# Patient Record
Sex: Female | Born: 1970 | Race: White | Hispanic: No | Smoking: Never smoker
Health system: Southern US, Community
[De-identification: ages and names within clinical notes are randomized; demographics above are authoritative.]

## PROBLEM LIST (undated history)

## (undated) DIAGNOSIS — F419 Anxiety disorder, unspecified: Secondary | ICD-10-CM

## (undated) DIAGNOSIS — E079 Disorder of thyroid, unspecified: Secondary | ICD-10-CM

## (undated) DIAGNOSIS — I8390 Asymptomatic varicose veins of unspecified lower extremity: Secondary | ICD-10-CM

## (undated) DIAGNOSIS — R42 Dizziness and giddiness: Secondary | ICD-10-CM

## (undated) HISTORY — DX: Disorder of thyroid, unspecified: E07.9

## (undated) HISTORY — DX: Asymptomatic varicose veins of unspecified lower extremity: I83.90

## (undated) HISTORY — DX: Anxiety disorder, unspecified: F41.9

## (undated) HISTORY — PX: LEEP: SHX91

---

## 2004-01-21 ENCOUNTER — Other Ambulatory Visit: Admission: RE | Admit: 2004-01-21 | Discharge: 2004-01-21 | Payer: Self-pay | Admitting: Obstetrics and Gynecology

## 2004-05-25 ENCOUNTER — Ambulatory Visit: Payer: Self-pay | Admitting: Psychology

## 2004-05-29 ENCOUNTER — Ambulatory Visit: Payer: Self-pay | Admitting: Psychology

## 2004-06-01 ENCOUNTER — Ambulatory Visit: Payer: Self-pay | Admitting: Psychology

## 2004-06-08 ENCOUNTER — Ambulatory Visit: Payer: Self-pay | Admitting: Psychology

## 2004-06-14 ENCOUNTER — Ambulatory Visit: Payer: Self-pay | Admitting: Psychology

## 2004-06-21 ENCOUNTER — Ambulatory Visit: Payer: Self-pay | Admitting: Psychology

## 2004-06-28 ENCOUNTER — Ambulatory Visit: Payer: Self-pay | Admitting: Psychology

## 2004-07-06 ENCOUNTER — Ambulatory Visit: Payer: Self-pay | Admitting: Psychology

## 2004-07-12 ENCOUNTER — Ambulatory Visit: Payer: Self-pay | Admitting: Psychology

## 2004-08-07 ENCOUNTER — Ambulatory Visit: Payer: Self-pay | Admitting: Psychology

## 2004-08-15 ENCOUNTER — Ambulatory Visit: Payer: Self-pay | Admitting: Psychology

## 2004-08-21 ENCOUNTER — Ambulatory Visit: Payer: Self-pay | Admitting: Psychology

## 2004-09-04 ENCOUNTER — Ambulatory Visit: Payer: Self-pay | Admitting: Psychology

## 2004-09-12 ENCOUNTER — Ambulatory Visit: Payer: Self-pay | Admitting: Psychology

## 2004-09-20 ENCOUNTER — Ambulatory Visit: Payer: Self-pay | Admitting: Psychology

## 2004-09-28 ENCOUNTER — Ambulatory Visit: Payer: Self-pay | Admitting: Psychology

## 2004-10-04 ENCOUNTER — Ambulatory Visit: Payer: Self-pay | Admitting: Psychology

## 2004-10-09 ENCOUNTER — Ambulatory Visit: Payer: Self-pay | Admitting: Psychology

## 2004-10-18 ENCOUNTER — Ambulatory Visit: Payer: Self-pay | Admitting: Psychology

## 2004-10-23 ENCOUNTER — Ambulatory Visit: Payer: Self-pay | Admitting: Psychology

## 2004-10-25 ENCOUNTER — Ambulatory Visit: Payer: Self-pay | Admitting: Psychology

## 2005-02-14 ENCOUNTER — Other Ambulatory Visit: Admission: RE | Admit: 2005-02-14 | Discharge: 2005-02-14 | Payer: Self-pay | Admitting: Obstetrics and Gynecology

## 2005-03-13 ENCOUNTER — Ambulatory Visit: Payer: Self-pay | Admitting: Psychology

## 2005-10-25 ENCOUNTER — Ambulatory Visit: Payer: Self-pay | Admitting: Psychology

## 2006-08-28 ENCOUNTER — Ambulatory Visit: Payer: Self-pay | Admitting: Psychology

## 2006-12-25 ENCOUNTER — Ambulatory Visit: Payer: Self-pay | Admitting: Psychology

## 2007-06-05 ENCOUNTER — Ambulatory Visit: Payer: Self-pay | Admitting: Psychology

## 2007-12-04 ENCOUNTER — Inpatient Hospital Stay (HOSPITAL_COMMUNITY): Admission: AD | Admit: 2007-12-04 | Discharge: 2007-12-07 | Payer: Self-pay | Admitting: Obstetrics and Gynecology

## 2007-12-05 ENCOUNTER — Encounter (INDEPENDENT_AMBULATORY_CARE_PROVIDER_SITE_OTHER): Payer: Self-pay | Admitting: Obstetrics and Gynecology

## 2008-03-15 HISTORY — PX: OTHER SURGICAL HISTORY: SHX169

## 2008-03-24 ENCOUNTER — Encounter: Admission: RE | Admit: 2008-03-24 | Discharge: 2008-03-24 | Payer: Self-pay | Admitting: Obstetrics and Gynecology

## 2008-03-24 ENCOUNTER — Ambulatory Visit (HOSPITAL_COMMUNITY): Admission: RE | Admit: 2008-03-24 | Discharge: 2008-03-24 | Payer: Self-pay | Admitting: Obstetrics and Gynecology

## 2008-05-03 ENCOUNTER — Ambulatory Visit: Payer: Self-pay | Admitting: Psychology

## 2008-09-02 ENCOUNTER — Ambulatory Visit: Payer: Self-pay | Admitting: Psychology

## 2008-09-09 ENCOUNTER — Ambulatory Visit: Payer: Self-pay | Admitting: Psychology

## 2008-09-13 ENCOUNTER — Ambulatory Visit: Payer: Self-pay | Admitting: Psychology

## 2008-09-16 ENCOUNTER — Ambulatory Visit: Payer: Self-pay | Admitting: Sports Medicine

## 2008-09-16 ENCOUNTER — Encounter: Payer: Self-pay | Admitting: Family Medicine

## 2008-09-16 DIAGNOSIS — M2140 Flat foot [pes planus] (acquired), unspecified foot: Secondary | ICD-10-CM | POA: Insufficient documentation

## 2008-09-16 DIAGNOSIS — R269 Unspecified abnormalities of gait and mobility: Secondary | ICD-10-CM | POA: Insufficient documentation

## 2009-12-29 ENCOUNTER — Ambulatory Visit: Payer: Self-pay | Admitting: Psychology

## 2010-01-04 ENCOUNTER — Ambulatory Visit: Payer: Self-pay | Admitting: Psychology

## 2010-01-15 NOTE — L&D Delivery Note (Signed)
Delivery Note At 6:49 AM a viable female was delivered via Vaginal, Spontaneous Delivery (Presentation: ; Occiput Anterior).  APGAR: 9, 9; weight 9 lb 1.3 oz (4119 g).   Placenta status: Intact, Spontaneous.  Cord: 3 vessels with the following complications: None.  Cord pH: none  Anesthesia: Local  Episiotomy: None Lacerations: 2nd degree and right lateral tear with repair Suture Repair: chromic Est. Blood Loss (mL):500 cc   Mom to postpartum.  Baby to nursery-stable.  Keyasia Jolliff S 09/11/2010, 7:19 AM

## 2010-01-24 LAB — ABO/RH: RH Type: POSITIVE

## 2010-01-24 LAB — RPR: RPR: NONREACTIVE

## 2010-01-24 LAB — RUBELLA ANTIBODY, IGM: Rubella: IMMUNE

## 2010-01-24 LAB — ANTIBODY SCREEN: Antibody Screen: NEGATIVE

## 2010-01-24 LAB — HEPATITIS B SURFACE ANTIGEN: Hepatitis B Surface Ag: NEGATIVE

## 2010-01-24 LAB — HIV ANTIBODY (ROUTINE TESTING W REFLEX): HIV: NONREACTIVE

## 2010-02-14 NOTE — Assessment & Plan Note (Signed)
Summary: NP ORTHOTICS/JW   Vital Signs:  Patient profile:   40 year old female Height:      66 inches Weight:      160 pounds BMI:     25.92 BP sitting:   106 / 60  Vitals Entered By: Lillia Pauls CMA (September 16, 2008 2:12 PM)  History of Present Illness: Patient presents as a referral from Dr. Thurston Hole of orthopaedics for new orthotics. He had diagnosed her with pes planus and metatarsalgia. She originally started having right medial ankle pain after spraining her right ankle in June of 2009. She was pregnant at the time and took a long time to heal. However, she then developed pain across the dorsal midfoot of her bilateral feet shortly afterwards with the left foot hurting more than the right. This has been worked up by Dr. Thurston Hole who referred her for custom orthotics. The patient typically works out by going to the gym and going on the ellipitcal machine. She exercises most days of the week. She has a 6 month old daughter at home. She uses the barely there shoes. Pain is typically worse in the morning or after she gets up from a rest.   Physical Exam  General:  alert, well-developed, well-nourished, and well-hydrated.   Head:  normocephalic and atraumatic.   Msk:  On inspection her left shoulder is higher than her right shoulder. On gait she has pronation, pes planus and wobbling of her bilateral knees with a normal hip motion.  No scoliosis or leg length discrepency noted.  Ankle: Right No visible erythema or bony abnormalities. Slight prominence of navicular. Prominent sinus tarsi with edema on lateral right ankle. Range of motion is full in all directions with 50 degrees of inversion. Strength is 5/5 in all directions. Stable lateral and medial ligaments; squeeze test and kleiger test unremarkable; Talar dome nontender; No pain at base of 5th MT; No tenderness over cuboid; No tenderness over N spot or navicular prominence No tenderness on posterior aspects of lateral and  medial malleolus No sign of peroneal tendon subluxations; Negative tarsal tunnel tinel's Able to walk 4 steps. Splayed between 1st and 2nd toe. Toes 3-5 curling under and almost hammer toes. Hindfoot valgus.  Pes planus. Pronation with walking.   Ankle: Left No visible erythema or swelling. Range of motion is full in all directions. 30 degrees of inversion.  Strength is 5/5 in all directions. Stable lateral and medial ligaments; squeeze test and kleiger test unremarkable; Talar dome nontender; No pain at base of 5th MT; No tenderness over cuboid; No tenderness over N spot or navicular prominence No tenderness on posterior aspects of lateral and medial malleolus No sign of peroneal tendon subluxations; Negative tarsal tunnel tinel's Able to walk 4 steps. Splayed between 1st and 2nd toe. Toes 3-5 curling under and almost hammer toes. Hindfoot valgus. Pes planus. Pronation with walking.   After orthotics created she walked in a neutral position and did not have wobbling of her knees or as significant pronation.      Impression & Recommendations:  Problem # 1:  PES PLANUS (ICD-734) Assessment New  Patient has significant pes planus causing her bilateral foot pain. She had custom orthotics made for her today.  Patient was fitted for a : standard, cushioned, semi-rigid orthotic. The orthotic was heated and afterward the patient stood on the orthotic blank positioned on the orthotic stand. The patient was positioned in subtalar neutral position and 10 degrees of ankle dorsiflexion in a  weight bearing stance. After completion of molding, a stable base was applied to the orthotic blank. The blank was ground to a stable position for weight bearing. Size: 7 bilaterally Base: F3 black soft base bilaterally Posting: ray bilaterally Additional orthotic padding: None  Orders: [employer], each unit (Z6109)  Problem # 2:  GAIT DISTURBANCE (ICD-781.2) Assessment:  New  Improved gait after putting on new orthotics. They were comfortable and she is to return in 4 weeks for follow-up.   Orders: [employer], each unit [phone])

## 2010-02-14 NOTE — Letter (Signed)
Summary: Generic Letter  [facility]  [address]   [place], [place] [address]   Phone: [phone]  Fax: [phone]    09/16/2008  [facility] [address] [place], [place]  [address]  Dear Dr. Thurston Hole,    We had the pleasure of seeing Valecia Beske in our sports medicine clinic today for custom orthotics. She was noted to have pes planus bilaterally, pronation, hindfoot valgus and forefoot breakdown. She had custom orthotics made today in the usual fashion which greatly improved her gait so that she was not having significant pronation while using the orthotics. She also felt very comfortable in the orthotics. We will see her back in 4 weeks for follow-up. Thank you very much for this consulation.  Sincerely,   Jannifer Rodney MD

## 2010-02-23 ENCOUNTER — Ambulatory Visit (INDEPENDENT_AMBULATORY_CARE_PROVIDER_SITE_OTHER): Payer: BC Managed Care – PPO | Admitting: Psychology

## 2010-02-23 DIAGNOSIS — F4322 Adjustment disorder with anxiety: Secondary | ICD-10-CM

## 2010-03-14 ENCOUNTER — Ambulatory Visit: Payer: BC Managed Care – PPO | Admitting: Psychology

## 2010-04-18 ENCOUNTER — Encounter: Payer: BC Managed Care – PPO | Admitting: Sports Medicine

## 2010-04-27 LAB — CBC
HCT: 39.1 % (ref 36.0–46.0)
Hemoglobin: 13.1 g/dL (ref 12.0–15.0)
MCHC: 33.7 g/dL (ref 30.0–36.0)
MCV: 89.7 fL (ref 78.0–100.0)
Platelets: 297 10*3/uL (ref 150–400)
RBC: 4.36 MIL/uL (ref 3.87–5.11)
RDW: 13.3 % (ref 11.5–15.5)
WBC: 9.6 10*3/uL (ref 4.0–10.5)

## 2010-04-27 LAB — PREGNANCY, URINE: Preg Test, Ur: NEGATIVE

## 2010-05-17 DIAGNOSIS — O479 False labor, unspecified: Secondary | ICD-10-CM

## 2010-05-30 NOTE — Discharge Summary (Signed)
Sabrina Nichols, Sabrina Nichols                  ACCOUNT NO.:  [id number]   MEDICAL RECORD NO.:  [id number]          PATIENT TYPE:  INP   LOCATION:  9146                          FACILITY:  [facility]   PHYSICIAN:  Osborn Coho, M.D.   DATE OF BIRTH:  08/06/1970   DATE OF ADMISSION:  12/04/2007  DATE OF DISCHARGE:  12/07/2007                               DISCHARGE SUMMARY   ADMITTING DIAGNOSES:  1. Intrauterine pregnancy at 39-5/7 weeks.  2. Premature rupture of membranes at term.  3. Group beta strep negative.  4. Reactive fetal heart tracing.   DISCHARGE DIAGNOSES:  1. Intrauterine pregnancy at 39-5/7 weeks.  2. Premature rupture of membranes at term.  3. Group beta strep negative.  4. Reactive fetal heart tracing.  5. Stable postop a primary low transverse cesarean section for non-      reassuring fetal heart tracing with delivery December 05, 2007 at      1730 hours.  Findings, a viable female infant by the name of Sophie      weighing 7 pounds even (3195 grams) and 20 inches in length.  6. Lactating.   HOSPITAL PROCEDURES:  1. Epidural anesthesia.  2. Primary low transverse cesarean section.   HOSPITAL COURSE:  Sabrina Nichols is a 40 year old gravida 1, para 0 who  presented on the day of admission December 04, 2007 at 39-5/7 weeks with  complaint of questionable SROM around 1:30 a.m. on the 19th.  She  reported clear fluid with only occasional uterine contractions.  Reported good fetal movement.  No bleeding.  Her cervix had been  fingertip a few days prior to that in the office.  Her pregnancy had  been remarkable for:  1. Group beta strep negative.  2. History of LEEP  3. Advanced maternal age with a negative and normal genetic screening.  4. Allergy to PERCOCET.   On admission, spontaneous rupture was confirmed with positive pooling,  positive fern, and positive Nitrazine and with clear fluid.  Cervix was  fingertip, 50% vertex presentation and minus 2 station.  The patient was  admitted to birthing suites.  Was offered augmentation or induction of  labor but at that time desired to continue observation for signs or  symptoms of labor.  She was afebrile and her vital signs were stable at  time of admission.  Throughout that day temperature was taken every 2  hours to observe for any signs or symptoms of infection.  Fetal heart  tracing was reactive with some irregular uterine contractions.  The  patient and husband desired to continue observation without any  interventions.  She continued leaking clear fluid throughout the day and  did complain of some just mild cramping.  Around 7 p.m. on the 19th  after consultation with Dr. Estanislado Pandy, an ACOG Technical Bulletin in August  2009 was discussed with the patient, which did give option for cervical  ripening with SROM.  The patient was recommended to begin Cervidil for  ripening and the patient was agreeable.  She had Cervidil placed x1 on  the night of the 19th around  8:15 p.m.  At that time her cervix was 1  cm, 50% and minus 2.  She remained afebrile.  Her vital signs were  stable and fetal heart tracing remained reactive and reassuring without  decelerations.  The patient was able to rest overnight and temperature  was continued every 2 hours to be monitored as well as for any other  signs or symptoms of infection.  She continued leaking clear fluid  overnight.  The morning of the 20th, she woke up around 4 a.m. with some  increasing contraction strength as well as pattern.  She did have some  pinkish discharge.  Cervidil was removed that a.m. and Pitocin was  begun.  At 10:45 a.m. on the 20th she was comfortable and employing  Hypnobirth measures with good results.  She remained afebrile.  Fetal  heart tracing remained reactive, but she did have occasional brief  variables ,less than 20 seconds in duration, and contractions were every  2-1/2 to 5 minutes and cervical exam was continued to be deferred to  decrease  risk of infection.  Around 1 p.m. the patient was requesting  epidural.  She was on 12 mU of Pitocin.  She was contracting every 2 to  3 minutes and they were strong.  The patient did have some decelerations  in supine position with nadir in 60 to 70s some times lasting for 2  minutes.  She did have a forebag that was ruptured around that time, and  she was around 4 cm.  At 1600 hours on the 20th she was complaining of  some rectal pressure but was otherwise comfortable status post epidural  placement as well as Foley placement.  Pitocin had been off secondary to  some inconsistent variables.  Her vaginal exam was 9 cm and +1 station  with rim on the right side.  The patient's variables were relieved off  and on with position changes and oxygen as well as IV bolus.  Around  1600 hours, the patient did have a deep deceleration with contractions  and Dr. Su Hilt was called to evaluate.  Pitocin remained off and her  option remained on the mask.  Around 4:30 p.m. Dr. Su Hilt was on unit  to evaluate fetal heart tracing.  The decelerations did resolve with the  patient in house high Fowler's position.  Fetal heart rate was in the  140s with moderate variability.  She was having some shallow variables  down to 120s of nadir, inconsistently not with every contraction.  She  was contracting every 2 to 4 minutes.  Pitocin remained off and plan was  made for expectant management.  Around 5:15 p.m. she also had a sudden  deep deceleration right after top of the hour.  Position was changed,  cervix was 10 cm plus 1 station and Janna Melsness, the C.N.M. did call  for terbutaline.  IV bolus was started.  Nadir was below 60 beats per  minute on fetal heart tracing, recovered to 150s after approximately 3  minutes, and then with the next contraction deceleration again returned  to approximately 70 beats per minute with some recovery after  stimulation to the 160s.  Following these decelerations,  C-section was  recommended to the patient.  Risks, benefits, alternatives were reviewed  and the patient was agreeable to proceed with C-section.  Some pushing  attempts were attempted prior to going to the OR.  The patient was taken  to OR where a primary low transverse cesarean section was  performed by  Dr. Osborn Coho attending and [facility], C.N[facility]M. assisting and  was under epidural anesthesia.  Findings were a viable female infant by  the name of Sophie delivered at 1730 hours on November 20, weighing 7  pounds even (3195 grams), and 20 inches in length.  The patient  tolerated well and went to recovery in good condition and newborn to  full-term nursery.  By postoperative day #1 the patient was up at  bedside.  She was ambulating without dizziness or syncope.  She was  tolerating a regular diet.  At that time she had a Foley catheter  recently discontinued.  She had not had a spontaneous void yet.  She was  breast-feeding with assistance, had light vaginal bleeding and pain was  well managed with Motrin and p.r.n. Darvocet.  Her vital signs were  afebrile and she was stable.  Intake showed 2153 mL and output was 850  mL, which is a positive net balance but her physical exam was within  normal limits.  Abdomen was soft, nontender.  Fundus was firm, U -1.  Her lungs were clear bilaterally.  Incision dressing was clean, dry and  intact.  She had mild 1+ edema, negative Homan's.  The patient did voice  desire for possible early discharge on postoperative day #2 and routine  postpartum care continued.  By postoperative day #2, on the 22nd, she  was doing well.  She did continue to voice desire for early discharge  home and felt like she had benefit from her stay.  Pain was well managed  with p.r.n. Darvocet and Motrin.  She was up ad lib tolerating a regular  diet.  Some increase in edema in all 4 extremities.  She had light  vaginal bleeding.  She is undecided on birth control  method.  She is  voiding without difficulty, positive flatulence.  She has continued  having some questions regarding labor and delivery and all were  answered.  The patient's physical exam remained within normal limits.  Her lungs were clear bilaterally.  Abdomen was soft and nontender and  bowel sounds present x4.  Fundus was firm, U -2 and her incision was  open to air, no drainage or redness and Dermabond was intact.  Her  extremities had 1 to 2+ pitting edema bilaterally lower extremities.  She had some generalized edema in her upper extremities but negative  Homan's.  The patient was deemed to have received full benefit of her  hospital stay and was discharged home in stable condition.   DISCHARGE MEDICATIONS:  1. Motrin 600 mg p.o. q.6 hours p.r.n. pain.  2. Darvocet-N 100 1 tablet p.o. q.4 hours p.r.n. moderate to severe      pain.  3. Prenatal vitamin 1 tablet p.o. daily.  4. Colace 100 mg 1 tablet p.o. b.i.d. p.r.n. constipation and while on      Darvocet.  5. She can also obtained vitamin B6 1 tablet p.o. b.i.d. p.r.n. edema.   DISCHARGE INSTRUCTIONS:  [facility] pamphlet.  Warning signs and symptoms to  report were reviewed with the patient.  Followup is to occur and 6 weeks  at [facility] or as needed.      Candice Denny Levy, CNM      ______________________________  Osborn Coho, M.D.    CHS/MEDQ  D:  12/07/2007  T:  12/07/2007  Job:  [id number]

## 2010-05-30 NOTE — Op Note (Signed)
NAMEARSEMA, TUSING                  ACCOUNT NO.:  [id number]   MEDICAL RECORD NO.:  [id number]          PATIENT TYPE:  AMB   LOCATION:  SDC                           FACILITY:  [facility]   PHYSICIAN:  Crist Fat. Rivard, M.D. DATE OF BIRTH:  03/25/1970   DATE OF PROCEDURE:  DATE OF DISCHARGE:                               OPERATIVE REPORT   PREOPERATIVE DIAGNOSIS:  Uterine perforation with abdominal intra-  abdominal intrauterine device.   POSTOPERATIVE DIAGNOSIS:  Uterine perforation with abdominal intra-  abdominal intrauterine device.   ANESTHESIA:  General.   PROCEDURE:  Laparoscopy with removal of intrauterine device from the  abdominal cavity.   SURGEON:  Crist Fat. Rivard, MD   No assistant.   ESTIMATED BLOOD LOSS:  Minimal.   PROCEDURE:  After being informed of the planned procedure with possible  complications including bleeding, infection, injury to other organs, and  the need for laparotomy, informed consent was obtained.  The patient was  taken to OR #3, given general anesthesia with endotracheal intubation  without any complication.  She was placed in lithotomy position, prepped  and draped in a sterile fashion, and a Foley catheter was inserted in  her bladder.  Pelvic exam reveals an anteverted uterus normal in size  and shape, 2 normal adnexa.  A speculum was inserted.  Anterior lip of  the cervix was grasped with a tenaculum forceps, and an acorn  manipulator was placed without difficulty.  The speculum was removed.   The umbilical area was infiltrated with 5 mL of Marcaine 0.25, and we  performed a semi-elliptical incision which was brought down bluntly to  the fascia.  The fascia was identified, grasped with Kocher forceps, and  incised with Mayo scissors and peritoneum was entered bluntly.  A  pursestring suture of 0 Vicryl was placed on the fascia holding in place  a 10-mm Hasson trocar which was easily inserted in the abdominal cavity.  This allowed for easy  insufflation of the pneumoperitoneum using CO2 at  a maximum pressure of 15 mmHg.   An operative laparoscope was inserted in the abdominal cavity while the  patient was still in lithotomy position with no Trendelenburg.  At this  time, we were able to visualize the liver and the gallbladder which are  normal and the appendix which was normal, but the IUD was not visible.  We proceeded with slow Trendelenburg positioning of the patient while  observing the bowel loops moving upward in the abdominal cavity hoping  to catch a glimpse of the IUD while this was happening.  Reviewing the  flat plate performed at [facility] earlier today, we see that  while lying down the IUD appeared to be in the right upper quadrant and  when standing up it moved freely to the lower pelvis.  After all the  small bowel loops have moved forward, we still have not visualized the  intrauterine device, and the patient was now placed in a steep  Trendelenburg position and the uterus was elevated.  The uterus shows a  previous site of perforation at  the right cornua which was partly  healed.  No bleeding is seen.  Both tubes and both ovaries were normal.  Posterior cul-de-sac was normal.  We did see little bit of adhesions  between the bladder flap and the lower uterine segment from the previous  C-section 4 months ago.  The large bowel was mobilized from one side to  the other, and we still can not visualize the IUD and so decision was  made to place a second trocar suprapubically 5 mm under direct  visualization after infiltrating with Marcaine 0.25 which allowed [place] to  use an atraumatic grasper and to systematically unroll all bowel loops.  We were lucky and we got a glimpse of the IUD and the omentum in the  right upper quadrant below the liver edge.  This was grasped and moved  downward towards the pelvis.  Some traction was required since the  omentum had kind of encapsulated both ears of the IUD, but  with gentle  traction and countertraction we were able to free the IUD and remove it  from the pelvic cavity through the 5-mm suprapubic trocar.  It was  complete.   We did require a little bit of cauterization on the omental edge after  removing the IUD, but this was performed easily with a Kleppinger.   We then proceeded with profuse irrigation of the abdominopelvic cavity  using 3 L of warm saline.   Trocars were then removed after evacuating the pneumoperitoneum.  The  fascia of the umbilical incision was closed with the previously placed  pursestring suture of 0 Vicryl.  Skin was closed with subcuticular  suture of 3-0 Monocryl and Dermabond.   Instruments and sponge count was complete x2.  Estimated blood loss was  minimal.  The procedure was well tolerated by the patient who was taken  to recovery room and discharge home in a well and stable condition.   SPECIMEN:  Mirena IUD was discarded.      Crist Fat Rivard, M.D.  Electronically Signed     SAR/MEDQ  D:  03/24/2008  T:  03/25/2008  Job:  [id number]

## 2010-05-30 NOTE — H&P (Signed)
NAMEFENDI, MEINHARDT                  ACCOUNT NO.:  [id number]   MEDICAL RECORD NO.:  [id number]          PATIENT TYPE:  INP   LOCATION:  9170                          FACILITY:  [facility]   PHYSICIAN:  Crist Fat. Rivard, M.D. DATE OF BIRTH:  12-30-1970   DATE OF ADMISSION:  12/04/2007  DATE OF DISCHARGE:                              HISTORY & PHYSICAL   Sabrina Nichols is a 40 year old gravida 1, para 0 at 39-6/7 weeks who  presents complaining of questionable spontaneous rupture of membranes at  approximately 1:30 a.m. with clear fluid noted and only occasional  uterine contractions since.  She reports positive fetal movement.  Cervix was closed to a fingertip last week. Pregnancy has been  remarkable for:  1. Group B strep negative.  2. History of LEEP procedure.  3. Advanced maternal age with negative genetic screening.  4. Allergy to Percocet.   PRENATAL LABS:  Blood type is AB positive, Rh antibody negative, VDRL  nonreactive, rubella titer positive, hepatitis B surface antigen  negative, HIV is nonreactive.  Toxo screens were negative.  First  trimester screen was negative.  AFP was normal.  GC and Chlamydia  cultures were negative in March.  Pap was normal in March.  Hemoglobin  upon entering the practice was 12.9.  It was within normal limits at 28  weeks.  This patient had a normal Glucola.  Group B strep culture was  negative at 36 weeks.   HISTORY OF PRESENT PREGNANCY:  The patient entered care at approximately  23 weeks as a transfer from [facility] requesting certified nurse  midwife care.  She had an ultrasound in the first trimester with an EDC  established of December 09, 2007 which was consistent with [facility] by LMP of  December 06, 2007. She had a small subcarinal hemorrhage noted on one of  her early ultrasounds. There was also an 18-week ultrasound showing a  marginal previa. At 23 weeks at [facility] she still had a  marginal anterior previa noted.  There were some  other limitations of  anatomy. This was reevaluated at 27 weeks with all anatomy structure  seen and ultrasound showed there was no longer a previa. Cervix had good  cervical length.  She did have hemorrhoids through her pregnancy that  were treated with topical agents. She had a normal Glucola.  She had  negative group B strep.  The rest of her pregnancy was essentially  uncomplicated.  She was seen by Dr. Estanislado Pandy this past week for the nurse  midwife service and she was closed to fingertip dilated.   OBSTETRICAL HISTORY:  The patient is a primigravida.   MEDICAL HISTORY:  The patient was a previous oral contraceptive user of  Yaz and Yasmin. She also had Seasonique and Seasonale.  She discontinued  all oral contraceptives in October 2008. At age 59 she had a LEEP  procedure for an abnormal Pap and has had normal Pap since.  Patient has  also had one UTI in the past.   PAST SURGICAL HISTORY:  Surgical history includes the previously noted  LEEP procedure at age 65 and wisdom teeth removed as a teenager.   She is sensitive to Percocet which causes vomiting.   FAMILY HISTORY:  Her maternal uncle has chronic hypertension.  Her  mother has varicosities.  Her brother has asthma.  Paternal grandfather  has diabetes.  Her mother is hypothyroid.    Genetic history is remarkable for the patient being 40 years old. The  patient's half-sisters child has Higashi syndrome   SOCIAL HISTORY:  The patient is married to the father of baby.  He is  involved and supportive. His name is Durwin Nora.  The patient is  [place] in ethnicity. She denies any religious affiliation.  She is  college educated.  She is employed in [employer]. Her husband has two  years of college.  He is a Emergency planning/management officer.  She has been followed by the  certified nurse midwife service at [facility].  She denies any  alcohol, drug or tobacco use during this pregnancy.   PHYSICAL EXAM:  VITAL SIGNS: Stable.  The patient  is afebrile.  HEENT: Within normal limits.  LUNGS:  Breath sounds are clear.  HEART:  Regular rate and rhythm without murmur.   BREASTS:  Soft and nontender.  ABDOMEN:  Fundal height is approximately 39 cm.  GENITOURINARY:  Estimated fetal weight 7-8 pounds.  Uterine contractions  are very occasional and mild. Serial speculum reveals positive pooling,  positive fern, positive Nitrazine. Fetal heart rate is reactive with no  decelerations.  There is clear fluid noted.  Cervix is a fingertip, 50%  vertex at minus two station.  EXTREMITIES:  Deep to reflexes are 2+ without clonus.  There is trace  edema noted.   IMPRESSION:  1. Intrauterine pregnancy at 39-6/7 weeks.  2. Premature rupture of membranes at term prior to the onset of labor.  3. Negative group B strep.  4. Reactive NFT.   PLAN:  1. After discussion with the patient regarding her options of      discharge home to await increased labor or admission for further      observation and await the onset of labor for the initiation of      Pitocin, the patient wishes to be admitted for observation. This      was done under the service of Dr. Silverio Lay, who is attending      physician.  2. Routine certified nurse midwife orders.  3. At present given the patient's desire to observe for the onset of      spontaneous labor, we will allow the patient to eat and allow her      to ambulate.  We will intermittently monitor the fetus. We will      monitor maternal vital signs and observe for any signs of infection      that may ensue. We will reevaluate her labor status in the evening      of November 19, and if  no labor has ensued by the morning of      November 20, then we will recommend starting      Pitocin.  4. Risks and benefits of prolonged rupture of membranes reviewed with      the patient, including infection, prolonged labor and other risk      factors. The patient still wishes to proceed with observational       plan.      Renaldo Reel Emilee Hero, C.N.M.      Crist Fat Rivard, M.D.  Electronically Signed    VLL/MEDQ  D:  12/04/2007  T:  12/04/2007  Job:  [id number]

## 2010-05-30 NOTE — H&P (Signed)
[name], [name]                  ACCOUNT NO.:  [id number]   MEDICAL RECORD NO.:  [id number]          PATIENT TYPE:  AMB   LOCATION:  SDC                           [facility]ILITY:  [facility]   PHYSICIAN:  Crist Fat. Rivard, M.D. DATE OF BIRTH:  [birth year]   DATE OF ADMISSION:  03/24/2008  DATE OF DISCHARGE:                              HISTORY & PHYSICAL   HISTORY OF PRESENT ILLNESS:  Sabrina Nichols is a 40 year old married breast-  feeding female para 1-0-0-1 who presented for an intrauterine device  insertion (Mirena) February 26, 2008, with complaints of pelvic pain  since that time.  The patient described her pain is intermittent, but  intense cramping primarily in her right pelvic region.  The patient did  find relief with ibuprofen and eventually over time, the patient's pain  decreased.  In spite of it decreasing however, the patient did have  persistent discomfort in her right lower quadrant therefore, she  returned for follow-up visit.  Her exam on March 16, 2008. did not  demonstrate any pelvic pain or tenderness however, it was also noted  that the patient's IUD string was no longer visible.  A pelvic  ultrasound on March 16, 2008, showed the intrauterine device within the  endometrial cavity with normal uterus and ovaries.  One week later due  to persistent though diminished pelvic discomfort the patient decided to  have the IUD removed under 3-D ultrasound guidance.  During the process  of exploring the endometrium for IUD removal, no IUD was seen.  A follow-  up plain abdominal film showed in the supine position that the IUD was  noted in the right midabdomen at the L5 level.  An additional review in  the erect position, the IUD was noted in the true pelvis.  Bowel gas  pattern was unremarkable and there was no free intraperitoneal gas.  Given these findings the patient has consented to proceed with  laparoscopic removal of her abdominal IUD.   PAST MEDICAL HISTORY:  1. OB history:   Gravida 1, para 1-0-0-1.  The patient underwent      cesarean section in December 05, 2007.  2. GYN history:  Menarche at 40 years old.  The patient is using      abstinence as a method of contraception.  Last menstrual period      February 29, 2008.  She has a history of human papillomavirus.  She      underwent a LEEP procedure at age 81.  She has had normal Pap      smears since then with the most recent being March, 2009.  3. Medical history is negative.  4. Surgical history is negative.   FAMILY HISTORY:  Positive for asthma and diabetes mellitus.   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  Habits:  She does not use  tobacco, alcohol or illicit drugs.   CURRENT MEDICATIONS:  1. Ibuprofen as needed.  2. Prenatal vitamins 1 tablet daily.   SHE IS ALLERGIC TO PERCOCET.  REVIEW OF SYSTEMS:  The patient denies any chest pain, shortness of  breath, headache, vision changes, nausea, vomiting, diarrhea, fever,  flank pain, urinary tract symptoms, vaginitis symptoms and except as is  mentioned in history of present illness the patient's review of systems  is otherwise negative.   PHYSICAL EXAMINATION:  Blood pressure 120/76, weight is 179, height is 5  feet, 6 inches tall.  BACK EXAM:  No CVA tenderness.  ABDOMINAL EXAM:  No tenderness, masses, or organomegaly.  PELVIC EXAM:  EG, BUS was normal.  Vagina was normal.  Cervix was  nontender without lesions.  There was no visible IUD string.  The uterus  normal size, shape and consistency without tenderness.  It is  anteverted.  Adnexa without tenderness or masses.  The patient's urine  pregnancy test is negative.  The patient's temperature was 97.8 degrees Fahrenheit orally.   Urinalysis was also negative.   IMPRESSION:  1. Pelvic pain.  2. Abdominal intrauterine (contraceptive) device.   DISPOSITION:  A discussion was held with the patient regarding the   indications for procedure, along with its risks which include, but are  not limited to reaction to anesthesia, damage to adjacent organs,  infection and excessive bleeding.  The patient verbalized understanding  of these risks and has consented to proceed with laparoscopic removal of  the Mirena intrauterine device at [facility] on March 24, 2008.      [name].      Crist Fat Rivard, M.D.  Electronically Signed    EJP/MEDQ  D:  03/24/2008  T:  03/24/2008  Job:  [id number]

## 2010-05-30 NOTE — Op Note (Signed)
Sabrina Nichols, Sabrina Nichols                  ACCOUNT NO.:  [id number]   MEDICAL RECORD NO.:  [id number]          PATIENT TYPE:  INP   LOCATION:  9146                          FACILITY:  [facility]   PHYSICIAN:  Osborn Coho, M.D.   DATE OF BIRTH:  04-26-1970   DATE OF PROCEDURE:  12/05/2007  DATE OF DISCHARGE:                               OPERATIVE REPORT   PREOPERATIVE DIAGNOSES:  1. Term intrauterine pregnancy.  2. Spontaneous rupture of membranes with Pitocin augmentation.  3. Nonreassuring fetal heart tracing.   POSTOPERATIVE DIAGNOSIS:  1. Term intrauterine pregnancy.  2. Spontaneous rupture of membranes with Pitocin augmentation.  3. Nonreassuring fetal heart tracing.  4. Occult cord.   PROCEDURE:  Primary low-transverse C-section.   ANESTHESIA:  Epidural.   ATTENDING:  Osborn Coho, MD   ASSISTANT:  Eulogio Bear, CNM.   FLUIDS:  1800 mL.   URINE OUTPUT:  90 mL.   ESTIMATED BLOOD LOSS:  700 mL.   COMPLICATIONS:  None.   FINDINGS:  Live female infant with Apgars of 9 at 1 minute and 9 at 5  minutes, weighing 7 pounds 0 ounces in Sophie normal-appearing  bilateral ovaries and fallopian tubes.   SPECIMEN:  Placenta to pathology.   COMPLICATIONS:  None.   PROCEDURE:  The patient was taken to the operating room after risks,  benefits, and alternatives discussed with the patient.  The patient  verbalized understanding and consent signed and witnessed.  The patient  was given a surgical level via the epidural, and prepped and draped in  normal sterile fashion in a supine position. A Pfannenstiel skin  incision was made and carried down to the underlying layer of fascia  with a scalpel and a Bovie.  The fascia was excised bilaterally in the  midline and extended bilaterally with the Mayo scissors.  Kocher clamps  were placed on the inferior aspect of the fascial incision and the  rectus muscle excised from the fascia.  The same was done in the  superior aspect of the  fascial incision.  The rectus muscle was  separated in the midline and the peritoneum entered bluntly and extended  manually.  The bladder blade was placed and bladder flap created with a  Metzenbaum scissors.  The uterine incision was made with a scalpel,  extended bilaterally with a bandage scissors.  Occult cord was noted and  infant was delivered in the vertex presentation.  The cord was clamped  and cut and the infant handed to the waiting pediatricians.  The  placenta was removed via fundal massage and the uterus cleared of all  clots and debris.  The uterine incision was repaired with 0 Vicryl in a  running locked fashion and a second imbricating layer was performed.  In  addition to an occult cord, there was a nuchal cord and a body cord.  After repairing the uterine incision in 2 layers with the second being  an imbricating layer the intra-abdominal cavity was copiously irrigated.  Bilateral adnexa appeared to be within normal limits.  The peritoneum  was repaired with 2-0 chromic  in a running fashion.  The fascia was  repaired with 0 Vicryl in a running fashion.  The subcutaneous tissue  was irrigated and made hemostatic with the Bovie.  The subcutaneous  tissue was reapproximated using 3 interrupted stitches of 2-0 plain.  The skin was reapproximated using 3-0 Monocryl via a subcuticular  stitch.  The end of the incisions for approximately 2 cm were prepped  with Dermabond.  The incision was dressed with a dressing.  Sponge, lap,  and needle count was correct.  The patient tolerated the procedure well  and was returned to the recovery room in good condition.      Osborn Coho, M.D.  Electronically Signed     AR/MEDQ  D:  12/05/2007  T:  12/06/2007  Job:  [id number]

## 2010-08-10 LAB — STREP B DNA PROBE: GBS: NEGATIVE

## 2010-09-10 ENCOUNTER — Inpatient Hospital Stay (HOSPITAL_COMMUNITY)
Admission: AD | Admit: 2010-09-10 | Discharge: 2010-09-12 | DRG: 373 | Disposition: A | Discharge status: Home / Self Care | Payer: BC Managed Care – PPO | Source: Ambulatory Visit | Attending: Obstetrics and Gynecology | Admitting: Obstetrics and Gynecology

## 2010-09-10 ENCOUNTER — Encounter (HOSPITAL_COMMUNITY): Payer: Self-pay

## 2010-09-10 DIAGNOSIS — O09529 Supervision of elderly multigravida, unspecified trimester: Secondary | ICD-10-CM | POA: Diagnosis present

## 2010-09-10 DIAGNOSIS — O34219 Maternal care for unspecified type scar from previous cesarean delivery: Secondary | ICD-10-CM | POA: Diagnosis present

## 2010-09-10 DIAGNOSIS — M2140 Flat foot [pes planus] (acquired), unspecified foot: Secondary | ICD-10-CM

## 2010-09-10 DIAGNOSIS — R269 Unspecified abnormalities of gait and mobility: Secondary | ICD-10-CM

## 2010-09-10 LAB — CBC
HCT: 36.6 % (ref 36.0–46.0)
Hemoglobin: 12.3 g/dL (ref 12.0–15.0)
MCH: 30.4 pg (ref 26.0–34.0)
MCHC: 33.6 g/dL (ref 30.0–36.0)
MCV: 90.4 fL (ref 78.0–100.0)
Platelets: 260 10*3/uL (ref 150–400)
RBC: 4.05 MIL/uL (ref 3.87–5.11)
RDW: 13.4 % (ref 11.5–15.5)
WBC: 12.2 10*3/uL — ABNORMAL HIGH (ref 4.0–10.5)

## 2010-09-10 LAB — POCT FERN TEST: Fern Test: POSITIVE

## 2010-09-10 MED ORDER — SODIUM CHLORIDE 0.9 % IJ SOLN: 3.0000 mL | Freq: Two times a day (BID) | INTRAMUSCULAR | Status: DC

## 2010-09-10 MED ORDER — LIDOCAINE HCL (PF) 1 % IJ SOLN
30.0000 mL | INTRAMUSCULAR | Status: DC | PRN
  Administered 2010-09-11: 30 mL via SUBCUTANEOUS
  Filled 2010-09-10: qty 30

## 2010-09-10 MED ORDER — SODIUM CHLORIDE 0.9 % IV SOLN: 250.0000 mL | INTRAVENOUS | Status: DC

## 2010-09-10 MED ORDER — SODIUM CHLORIDE 0.9 % IJ SOLN: 3.0000 mL | INTRAMUSCULAR | Status: DC | PRN

## 2010-09-10 MED ORDER — CITRIC ACID-SODIUM CITRATE 334-500 MG/5ML PO SOLN: 30.0000 mL | ORAL | Status: DC | PRN

## 2010-09-10 NOTE — Progress Notes (Signed)
Pt ambulating in hallway. Adjusting FHR cardio monitor. Pt states she feels fetal movement.

## 2010-09-10 NOTE — Progress Notes (Signed)
Pt ambulating, coming back to room. Pt feels fetal movement. Adjusting FHR cardio, on telemetry.

## 2010-09-10 NOTE — Progress Notes (Signed)
Pt demanding to speak to an MD, if Dr Arelia Sneddon will not talk with her, she would like to speak to DR Vincente Poli. Dr Arelia Sneddon agreed to speak with pt over phone.

## 2010-09-10 NOTE — Progress Notes (Signed)
Pt reports LOF @ 0900.

## 2010-09-10 NOTE — Progress Notes (Signed)
Patient wearing a pad wet with clear fluid, contractions 5 to 6 minutes, 3 cm last Wednesday.

## 2010-09-10 NOTE — Progress Notes (Signed)
Pt ambulating in hallway. Pt states she feels fetal movement. Adjusting FHR cardio monitor, on telemetry difficulty keeping monitors in place while walking. Dr Arelia Sneddon notified of this issue. Wants pt to remain on continuous monitoring.

## 2010-09-10 NOTE — Plan of Care (Signed)
Problem: Consults Goal: Birthing Suites Patient Information Press F2 to bring up selections list  Outcome: Completed/Met Date Met:  09/10/10  Pt > [redacted] weeks EGA and Other (specify with a note) VBAC

## 2010-09-10 NOTE — Progress Notes (Signed)
Pt ambulating in hallway. Pt states she continues to feel fetal movement. Adjusting FHR cardio. On telemetry, difficulty keeping monitors in place.

## 2010-09-10 NOTE — H&P (Signed)
Sabrina Nichols is a 40 y.o. female at 54 weeks presents with srom.  proir low transverse cesarean section.  Desire trial of labor .  GBS negative. Maternal Medical History:  Reason for admission: Reason for admission: rupture of membranes.  Contractions: Onset was 1-2 hours ago.   Frequency: regular.   Perceived severity is mild.    Fetal activity: Perceived fetal activity is normal.    Prenatal Complications - Diabetes: none.    OB History    Grav Para Term Preterm Abortions TAB SAB Ect Mult Living   2 1 1       1      No past medical history on file. No past surgical history on file. Family History: family history is not on file. Social History:  does not have a smoking history on file. She does not have any smokeless tobacco history on file. Her alcohol and drug histories not on file.  Review of Systems  All other systems reviewed and are negative.      Blood pressure 126/76, pulse 77, temperature 97.7 F (36.5 C), temperature source Oral, resp. rate 20, height 5\' 6"  (1.676 m), weight 205 lb (92.987 kg). Maternal Exam:  Uterine Assessment: Contraction strength is mild.  Contraction frequency is irregular.   Abdomen: Patient reports no abdominal tenderness. Surgical scars: low transverse.   Fundal height is c/w dates.   Estimated fetal weight is 8 lbs.   Fetal presentation: vertex  Introitus: Amniotic fluid character: clear.  Cervix: Cervix evaluated by sterile speculum exam.     Physical Exam  Prenatal labs: ABO, Rh:   Antibody:   Rubella:   RPR:    HBsAg:    HIV:    GBS:     Assessment/Plan: IUP at 41 weeks with SROM.  Pior LTCS for TOL.  Risk discussed including 1-2% risk of uterine scar rupture.  This could result in need for transfusion with risk of AIDS or Hepatitis.  Excessive bleeding could lead to need for Hysterectomy.  Potential fetal damage of death with scar rupture occurs 3% or time with rupture.  Patient express a understanding of potential risk and  conplications.     Sabrina Nichols S 09/10/2010, 7:35 PM

## 2010-09-10 NOTE — ED Provider Notes (Signed)
History     Chief Complaint  Patient presents with  . Rupture of Membranes  . Contractions   HPI  Asked to do speculum exam to rule out rupture of membranes.  No past medical history on file.  No past surgical history on file.  No family history on file.  History  Substance Use Topics  . Smoking status: Not on file  . Smokeless tobacco: Not on file  . Alcohol Use: Not on file    Allergies:  Allergies  Allergen Reactions  . Percocet (Oxycodone-Acetaminophen) Nausea And Vomiting    Prescriptions prior to admission  Medication Sig Dispense Refill  . prenatal vitamin w/FE, FA (PRENATAL 1 + 1) 27-1 MG TABS Take 1 tablet by mouth daily.          ROS Physical Exam   Blood pressure 125/84, pulse 81, temperature 99.1 F (37.3 C), resp. rate 18, height 5\' 6"  (1.676 m), weight 208 lb 12.8 oz (94.711 kg).  Physical Exam Speculum:  Small amount pooling of clear fluid.  + Fern Cervix 3cm/80/-2/vtx  MAU Course  Procedures  MDM   Assessment and Plan  Will have RN notify MD for admission.  Wynelle Bourgeois 09/10/2010, 6:16 PM

## 2010-09-11 ENCOUNTER — Encounter (HOSPITAL_COMMUNITY): Payer: Self-pay | Admitting: *Deleted

## 2010-09-11 LAB — RPR: RPR Ser Ql: NONREACTIVE

## 2010-09-11 LAB — CBC
HCT: 29.1 % — ABNORMAL LOW (ref 36.0–46.0)
Hemoglobin: 9.7 g/dL — ABNORMAL LOW (ref 12.0–15.0)
MCH: 30.2 pg (ref 26.0–34.0)
MCHC: 33.3 g/dL (ref 30.0–36.0)
MCV: 90.7 fL (ref 78.0–100.0)
Platelets: 219 10*3/uL (ref 150–400)
RBC: 3.21 MIL/uL — ABNORMAL LOW (ref 3.87–5.11)
RDW: 13.2 % (ref 11.5–15.5)
WBC: 18.1 10*3/uL — ABNORMAL HIGH (ref 4.0–10.5)

## 2010-09-11 MED ORDER — BISACODYL 10 MG RE SUPP: 10.0000 mg | Freq: Every day | RECTAL | Status: DC | PRN

## 2010-09-11 MED ORDER — IBUPROFEN 600 MG PO TABS
600.0000 mg | ORAL_TABLET | Freq: Four times a day (QID) | ORAL | Status: DC
  Administered 2010-09-11 – 2010-09-12 (×4): 600 mg via ORAL
  Filled 2010-09-11 (×2): qty 1

## 2010-09-11 MED ORDER — LANOLIN HYDROUS EX OINT: TOPICAL_OINTMENT | CUTANEOUS | Status: DC | PRN

## 2010-09-11 MED ORDER — BENZOCAINE-MENTHOL 20-0.5 % EX AERO
INHALATION_SPRAY | CUTANEOUS | Status: AC
  Administered 2010-09-11: 1 via TOPICAL
  Filled 2010-09-11: qty 56

## 2010-09-11 MED ORDER — ZOLPIDEM TARTRATE 5 MG PO TABS: 5.0000 mg | ORAL_TABLET | Freq: Every evening | ORAL | Status: DC | PRN

## 2010-09-11 MED ORDER — FLEET ENEMA 7-19 GM/118ML RE ENEM: 1.0000 | ENEMA | RECTAL | Status: DC | PRN

## 2010-09-11 MED ORDER — SIMETHICONE 80 MG PO CHEW: 80.0000 mg | CHEWABLE_TABLET | ORAL | Status: DC | PRN

## 2010-09-11 MED ORDER — LACTATED RINGERS IV SOLN
INTRAVENOUS | Status: DC | PRN
  Administered 2010-09-11: 300 mL via INTRAVENOUS
  Administered 2010-09-11: 999 mL/h via INTRAVENOUS

## 2010-09-11 MED ORDER — PRENATAL PLUS 27-1 MG PO TABS
1.0000 | ORAL_TABLET | Freq: Every day | ORAL | Status: DC
  Administered 2010-09-12: 1 via ORAL
  Filled 2010-09-11 (×2): qty 1

## 2010-09-11 MED ORDER — IBUPROFEN 600 MG PO TABS
600.0000 mg | ORAL_TABLET | Freq: Four times a day (QID) | ORAL | Status: DC | PRN
  Administered 2010-09-11: 600 mg via ORAL
  Filled 2010-09-11 (×3): qty 1

## 2010-09-11 MED ORDER — BENZOCAINE-MENTHOL 20-0.5 % EX AERO
1.0000 "application " | INHALATION_SPRAY | CUTANEOUS | Status: DC | PRN
  Administered 2010-09-11: 1 via TOPICAL

## 2010-09-11 MED ORDER — WITCH HAZEL-GLYCERIN EX PADS
1.0000 "application " | MEDICATED_PAD | CUTANEOUS | Status: DC | PRN
  Administered 2010-09-12: 1 via TOPICAL

## 2010-09-11 MED ORDER — DIBUCAINE 1 % RE OINT: 1.0000 "application " | TOPICAL_OINTMENT | RECTAL | Status: DC | PRN

## 2010-09-11 MED ORDER — ONDANSETRON HCL 4 MG PO TABS: 4.0000 mg | ORAL_TABLET | ORAL | Status: DC | PRN

## 2010-09-11 MED ORDER — OXYTOCIN 20 UNITS IN LACTATED RINGERS INFUSION - SIMPLE
125.0000 mL/h | Freq: Once | INTRAVENOUS | Status: AC
  Administered 2010-09-11: 125 mL/h via INTRAVENOUS
  Filled 2010-09-11: qty 1000

## 2010-09-11 MED ORDER — PRENATAL PLUS 27-1 MG PO TABS: 1.0000 | ORAL_TABLET | Freq: Every day | ORAL | Status: DC

## 2010-09-11 MED ORDER — OXYTOCIN 20 UNITS IN LACTATED RINGERS INFUSION - SIMPLE
125.0000 mL/h | INTRAVENOUS | Status: DC
  Administered 2010-09-11: 999 mL/h via INTRAVENOUS
  Filled 2010-09-11: qty 1000

## 2010-09-11 MED ORDER — ONDANSETRON HCL 4 MG/2ML IJ SOLN: 4.0000 mg | INTRAMUSCULAR | Status: DC | PRN

## 2010-09-11 MED ORDER — DIPHENHYDRAMINE HCL 25 MG PO CAPS: 25.0000 mg | ORAL_CAPSULE | Freq: Four times a day (QID) | ORAL | Status: DC | PRN

## 2010-09-11 MED ORDER — SENNOSIDES-DOCUSATE SODIUM 8.6-50 MG PO TABS
2.0000 | ORAL_TABLET | Freq: Every day | ORAL | Status: DC
  Administered 2010-09-11: 2 via ORAL

## 2010-09-11 MED ORDER — TETANUS-DIPHTH-ACELL PERTUSSIS 5-2.5-18.5 LF-MCG/0.5 IM SUSP: 0.5000 mL | Freq: Once | INTRAMUSCULAR | Status: DC

## 2010-09-11 MED ORDER — METHYLERGONOVINE MALEATE 0.2 MG PO TABS
0.2000 mg | ORAL_TABLET | ORAL | Status: AC
  Administered 2010-09-11 – 2010-09-12 (×6): 0.2 mg via ORAL
  Filled 2010-09-11 (×6): qty 1

## 2010-09-11 NOTE — Progress Notes (Signed)
Jenan Ellegood is a 40 y.o. G2P1001 at [redacted]w[redacted]d by LMP admitted for rupture of membranes  Subjective:   Objective: BP 85/47  Pulse 86  Temp(Src) 97.4 F (36.3 C) (Oral)  Resp 22  Ht 5\' 6"  (1.676 m)  Wt 205 lb (92.987 kg)  BMI 33.09 kg/m2      FHT:  FHR: 140 bpm, variability: moderate,  accelerations:  Present,  decelerations:  Present variables with pushing UC:   regular, every 3 minutes SVE:   Dilation: 10 Effacement (%): 100 Station: +1;0 Exam by:: L.Mears,RN  Labs: Lab Results  Component Value Date   WBC 12.2* 09/10/2010   HGB 12.3 09/10/2010   HCT 36.6 09/10/2010   MCV 90.4 09/10/2010   PLT 260 09/10/2010    Assessment / Plan: Spontaneous labor, progressing normally  Labor: Progressing normally Preeclampsia:  no signs or symptoms of toxicity Fetal Wellbeing:  Category I Pain Control:  Labor support without medications I/D:  n/a Anticipated MOD:  NSVD  Thaily Hackworth S 09/11/2010, 6:30 AM

## 2010-09-11 NOTE — Progress Notes (Signed)
Pt transferred to [facility] room #111 via wheelchair.  Report given to [facility] RN

## 2010-09-11 NOTE — Progress Notes (Signed)
Dr. Rana Snare notified of pt status, pt up to the bathroom and passed out twice while on the toilet, pt returned to bed safety with steady and RN assistance.  Dr. Rana Snare notified of pt's bleeding and few small clots noted, and pt's BPs.  Orders received, will continue to monitor.

## 2010-09-11 NOTE — Progress Notes (Signed)
Pt passed out twice while sitting on the toilet.  Pt leaned safety to the wall with RN assistance.  Pt remained sitting on the toilet, she did not fall. RN called for assistance, IV bolus restarted, ammonia used, and pt returned safely to bed via steady with RN assistance.  IV came out during transfer.  IV restarted in right arm, by Bonnielee Haff, RNC, 18G.  Dr. Rana Snare notified and orders received, will continue to monitor.

## 2010-09-11 NOTE — Progress Notes (Signed)
Dr. Rana Snare notified of pt status, lab results, pt's bleeding, and pt's BPs.  Orders received to transfer pt to [facility].  Will continue to monitor.

## 2010-09-12 LAB — CBC
HCT: 23.3 % — ABNORMAL LOW (ref 36.0–46.0)
Hemoglobin: 7.9 g/dL — ABNORMAL LOW (ref 12.0–15.0)
MCH: 30.6 pg (ref 26.0–34.0)
MCHC: 33.9 g/dL (ref 30.0–36.0)
MCV: 90.3 fL (ref 78.0–100.0)
Platelets: 197 10*3/uL (ref 150–400)
RBC: 2.58 MIL/uL — ABNORMAL LOW (ref 3.87–5.11)
RDW: 13.4 % (ref 11.5–15.5)
WBC: 13.7 10*3/uL — ABNORMAL HIGH (ref 4.0–10.5)

## 2010-09-12 MED ORDER — IBUPROFEN 800 MG PO TABS: 800.0000 mg | ORAL_TABLET | Freq: Three times a day (TID) | ORAL | Status: AC | PRN

## 2010-09-12 NOTE — Discharge Summary (Signed)
Obstetric Discharge Summary Reason for Admission: rupture of membranes Prenatal Procedures: none Intrapartum Procedures: spontaneous vaginal delivery Postpartum Procedures: none Complications-Operative and Postpartum: 2 degree perineal laceration Hemoglobin  Date Value Range Status  09/12/2010 7.9* 12.0-15.0 (Nichols/dL) Final     DELTA CHECK NOTED     REPEATED TO VERIFY     HCT  Date Value Range Status  09/12/2010 23.3* 36.0-46.0 (%) Final    Discharge Diagnoses: Term Pregnancy-delivered  Discharge Information: Date: 09/12/2010 Activity: pelvic rest Diet: routine Medications: PNV and Ibuprophenand FE Condition: stable Instructions: refer to practice specific booklet Discharge to: home   Newborn Data: Live born female  Birth Weight: 9 lb 1.3 oz ([id number] Nichols) APGAR: 9, 9  Home with mother.  Sabrina Nichols 09/12/2010, 8:35 AM

## 2010-09-12 NOTE — Progress Notes (Signed)
Post Partum Day 1 Subjective: no complaints, up ad lib, voiding, tolerating PO and + flatus. Desires early discharge  Objective: Blood pressure 94/63, pulse 87, temperature 98 F (36.7 C), temperature source Oral, resp. rate 18, height 5\' 6"  (1.676 m), weight 92.987 kg (205 lb), SpO2 100.00%, unknown if currently breastfeeding.  Physical Exam:  General: alert and cooperative Lochia: appropriate Uterine Fundus: firm Perineum intact DVT Evaluation: No evidence of DVT seen on physical exam.   Basename 09/12/10 0546 09/11/10 0939  HGB 7.9* 9.7*  HCT 23.3* 29.1*    Assessment/Plan: Discharge home   LOS: 2 days   Birgit Nowling G 09/12/2010, 8:17 AM

## 2010-10-17 LAB — CBC
HCT: 29.4 — ABNORMAL LOW
HCT: 33.8 — ABNORMAL LOW
HCT: 35.7 — ABNORMAL LOW
Hemoglobin: 10.3 — ABNORMAL LOW
Hemoglobin: 11.8 — ABNORMAL LOW
Hemoglobin: 12.1
MCHC: 33.8
MCHC: 34.9
MCHC: 35.1
MCV: 92.7
MCV: 93.3
MCV: 93.8
Platelets: 178
Platelets: 213
Platelets: 213
RBC: 3.16 — ABNORMAL LOW
RBC: 3.64 — ABNORMAL LOW
RBC: 3.81 — ABNORMAL LOW
RDW: 12.5
RDW: 12.6
RDW: 12.7
WBC: 11.3 — ABNORMAL HIGH
WBC: 13.6 — ABNORMAL HIGH
WBC: 15.1 — ABNORMAL HIGH

## 2010-10-17 LAB — RPR
RPR Ser Ql: NONREACTIVE
RPR Ser Ql: NONREACTIVE

## 2011-02-02 IMAGING — CR DG ABDOMEN 2V
2 series · 2 of 2 positions shown · non-contrast
Comparison: None

CLINICAL DATA: Missing IUD.

ABDOMEN - 2 VIEW

[w abdomen upright *]
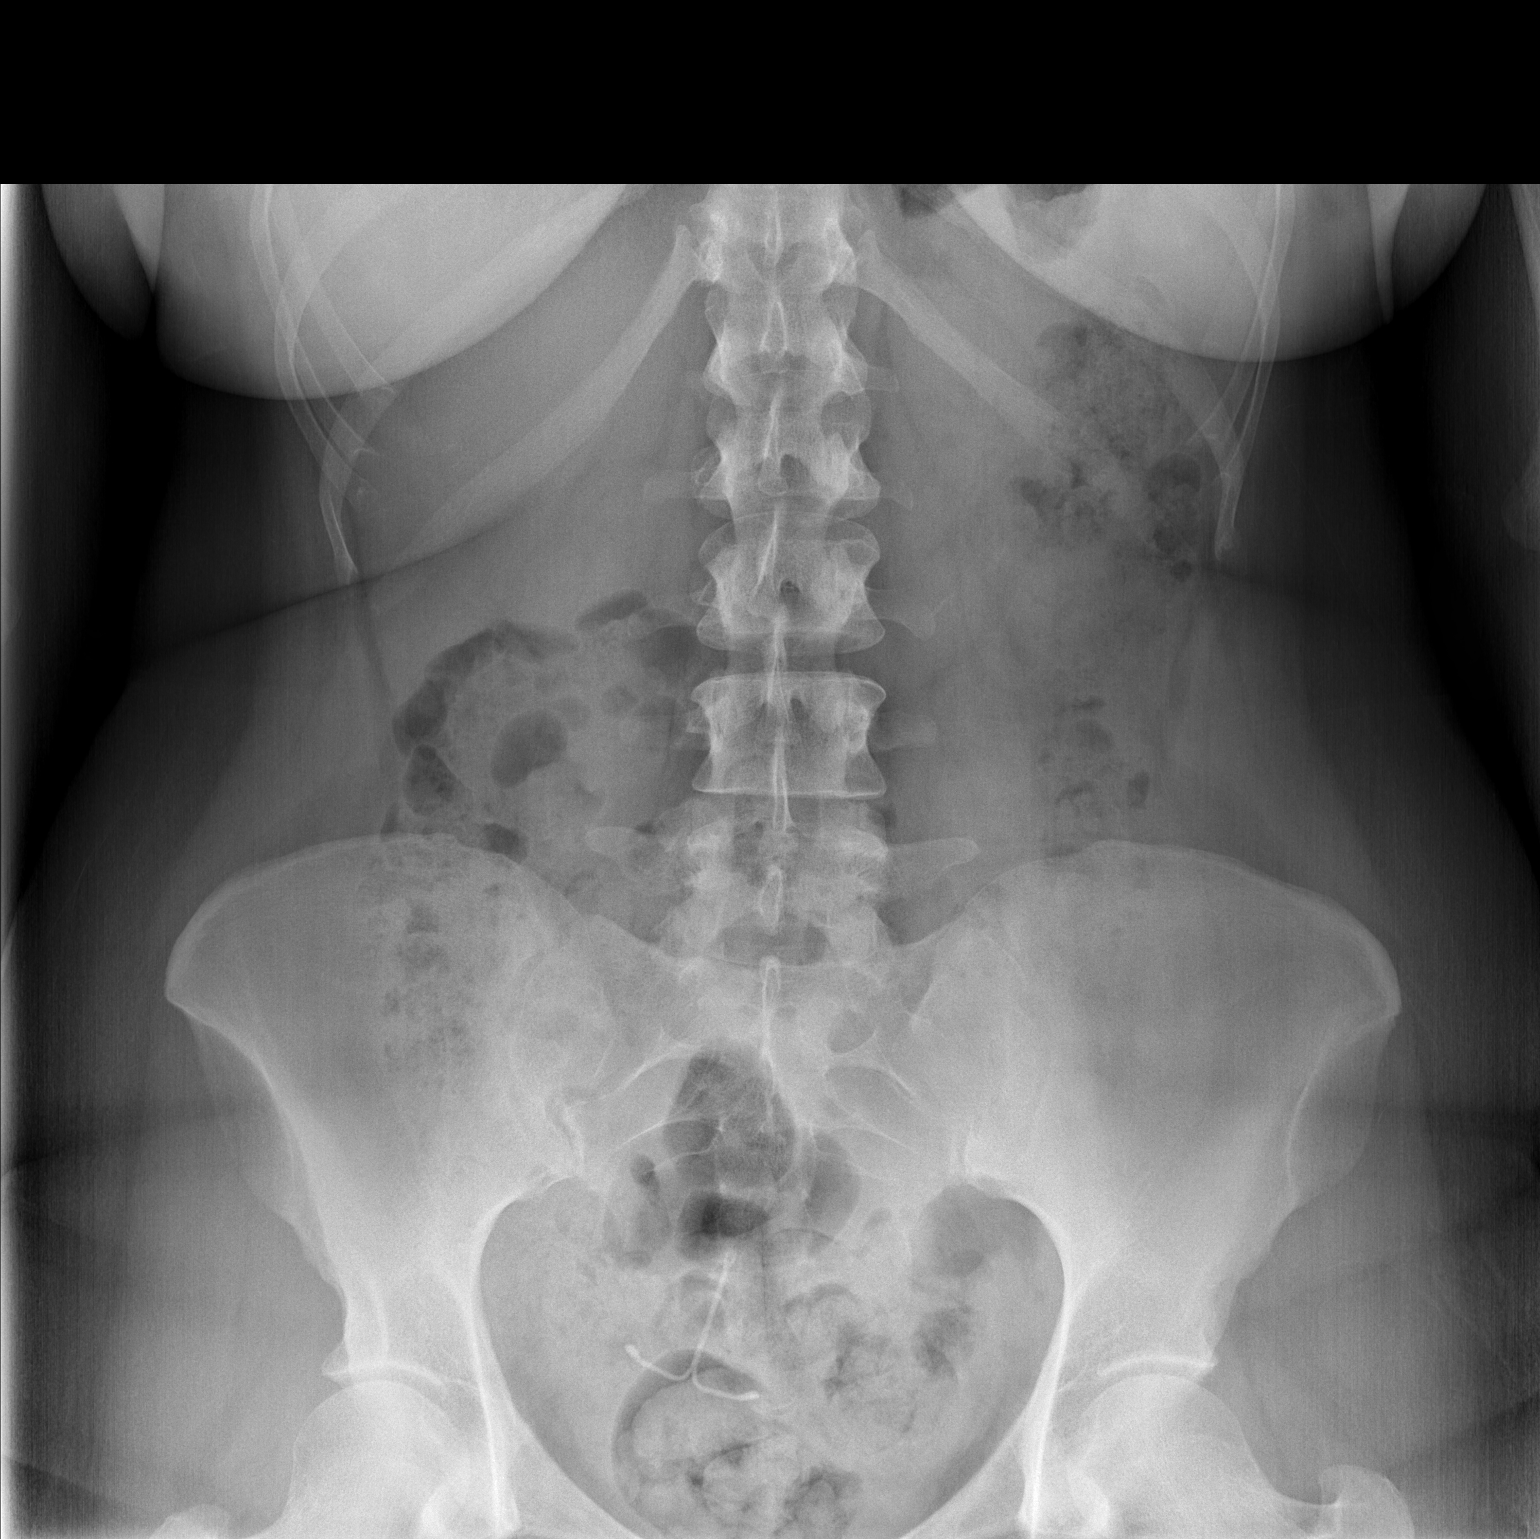

[t abdomen supine]
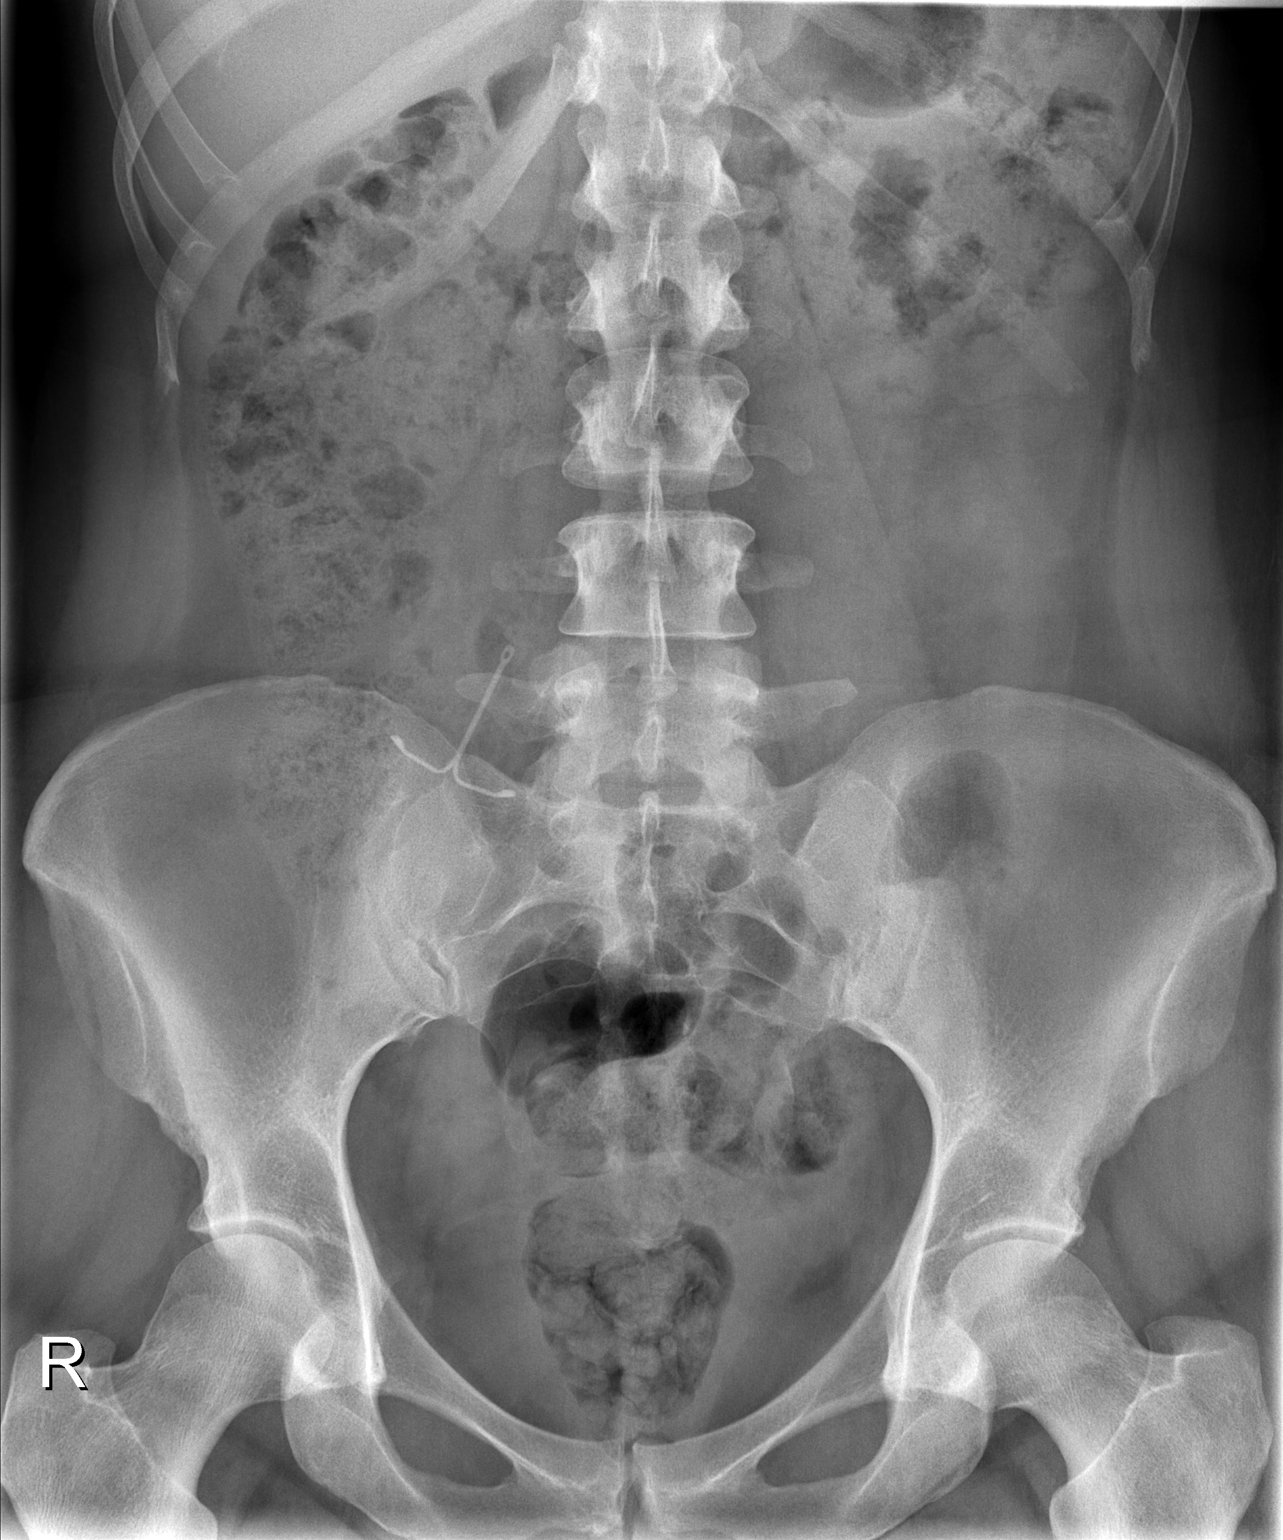

[2 of 2 positions shown; findings below may reference images not displayed]

FINDINGS: In the supine position, the IUD is noted in the right mid
abdomen at the L5 level.  In the erect position the IUD is noted in
the true pelvis.  Bowel gas pattern unremarkable.  No free
intraperitoneal gas.
IMPRESSION: Findings compatible with a mobile IUD,  likely lying within the
peritoneal cavity.

## 2011-02-27 ENCOUNTER — Ambulatory Visit: Payer: BC Managed Care – PPO

## 2011-03-07 ENCOUNTER — Ambulatory Visit (INDEPENDENT_AMBULATORY_CARE_PROVIDER_SITE_OTHER): Payer: Self-pay

## 2011-03-07 DIAGNOSIS — F4320 Adjustment disorder, unspecified: Secondary | ICD-10-CM

## 2011-05-28 ENCOUNTER — Ambulatory Visit (INDEPENDENT_AMBULATORY_CARE_PROVIDER_SITE_OTHER): Payer: BC Managed Care – PPO | Admitting: Psychology

## 2011-05-28 DIAGNOSIS — F438 Other reactions to severe stress: Secondary | ICD-10-CM

## 2011-06-04 ENCOUNTER — Ambulatory Visit: Payer: BC Managed Care – PPO | Admitting: Psychology

## 2011-06-21 ENCOUNTER — Ambulatory Visit: Payer: BC Managed Care – PPO | Admitting: Psychology

## 2011-06-28 ENCOUNTER — Ambulatory Visit: Payer: BC Managed Care – PPO | Admitting: Psychology

## 2011-07-25 ENCOUNTER — Ambulatory Visit (INDEPENDENT_AMBULATORY_CARE_PROVIDER_SITE_OTHER): Payer: BC Managed Care – PPO | Admitting: Psychology

## 2011-07-25 DIAGNOSIS — F4320 Adjustment disorder, unspecified: Secondary | ICD-10-CM

## 2011-08-09 ENCOUNTER — Ambulatory Visit (INDEPENDENT_AMBULATORY_CARE_PROVIDER_SITE_OTHER): Payer: BC Managed Care – PPO | Admitting: Psychology

## 2011-08-09 DIAGNOSIS — F4320 Adjustment disorder, unspecified: Secondary | ICD-10-CM

## 2011-08-15 ENCOUNTER — Ambulatory Visit: Payer: BC Managed Care – PPO | Admitting: Psychology

## 2011-08-22 ENCOUNTER — Ambulatory Visit (INDEPENDENT_AMBULATORY_CARE_PROVIDER_SITE_OTHER): Payer: BC Managed Care – PPO | Admitting: Psychology

## 2011-08-22 DIAGNOSIS — F4320 Adjustment disorder, unspecified: Secondary | ICD-10-CM

## 2011-09-04 ENCOUNTER — Ambulatory Visit: Payer: BC Managed Care – PPO | Admitting: Psychology

## 2012-03-06 ENCOUNTER — Other Ambulatory Visit: Payer: Self-pay | Admitting: Gastroenterology

## 2012-03-06 DIAGNOSIS — R109 Unspecified abdominal pain: Secondary | ICD-10-CM

## 2012-03-06 DIAGNOSIS — R110 Nausea: Secondary | ICD-10-CM

## 2012-03-17 ENCOUNTER — Other Ambulatory Visit (HOSPITAL_COMMUNITY): Payer: BC Managed Care – PPO

## 2012-07-31 ENCOUNTER — Encounter: Payer: Self-pay | Admitting: Vascular Surgery

## 2012-08-01 ENCOUNTER — Encounter (INDEPENDENT_AMBULATORY_CARE_PROVIDER_SITE_OTHER): Payer: BC Managed Care – PPO | Admitting: Vascular Surgery

## 2012-08-01 ENCOUNTER — Encounter: Payer: Self-pay | Admitting: Vascular Surgery

## 2012-08-01 ENCOUNTER — Ambulatory Visit (INDEPENDENT_AMBULATORY_CARE_PROVIDER_SITE_OTHER): Payer: BC Managed Care – PPO | Admitting: Vascular Surgery

## 2012-08-01 VITALS — BP 94/65 | HR 76 | Resp 16 | Ht 65.0 in | Wt 158.0 lb

## 2012-08-01 DIAGNOSIS — I872 Venous insufficiency (chronic) (peripheral): Secondary | ICD-10-CM | POA: Insufficient documentation

## 2012-08-01 DIAGNOSIS — M79609 Pain in unspecified limb: Secondary | ICD-10-CM

## 2012-08-01 DIAGNOSIS — I83893 Varicose veins of bilateral lower extremities with other complications: Secondary | ICD-10-CM | POA: Insufficient documentation

## 2012-08-01 NOTE — Progress Notes (Signed)
[facility] [place]  Referred by:  Self  Reason for referral: Bilateral leg varicosities  History of Present Illness  Sabrina Nichols is a 42 y.o. (March 20, 1970) female who presents with chief complaint: R>L ankle pain and wrist pain.  Patient notes, onset of varicosities after her second pregnancy.  The patient's symptoms include: pain sx c/w repetitive stress related to her occupation.  The patient has had no history of DVT, known history of varicose vein, no history of venous stasis ulcers, no history of  Lymphedema and no history of skin changes in lower legs.  There is a family history of venous disorders.  The patient has not used medical grade compression stockings in the past.  PMH Denies significant medical problems  Past Surgical History  Procedure Laterality Date  . Laproscopic surgery to remove iud  March 2010  . Cesarean section  Nov. 2009    History   Social History  . Marital Status: Married    Spouse Name: N/A    Number of Children: N/A  . Years of Education: N/A   Occupational History  . Not on file.   Social History Main Topics  . Smoking status: Never Smoker   . Smokeless tobacco: Never Used  . Alcohol Use: No  . Drug Use: No  . Sexually Active: Yes   Other Topics Concern  . Not on file   Social History Narrative  . No narrative on file    Family History  Problem Relation Age of Onset  . Deep vein thrombosis Mother     Current Outpatient Prescriptions on File Prior to Visit  Medication Sig Dispense Refill  . prenatal vitamin w/FE, FA (PRENATAL 1 + 1) 27-1 MG TABS Take 1 tablet by mouth daily.         No current facility-administered medications on file prior to visit.    Allergies  Allergen Reactions  . Percocet (Oxycodone-Acetaminophen) Nausea And Vomiting   REVIEW OF SYSTEMS:  (Positives checked otherwise negative)  CARDIOVASCULAR:  []  chest pain, []  chest pressure, []  palpitations, []  shortness of breath when laying  flat, []  shortness of breath with exertion,  []  pain in feet when walking, [x]  pain in feet when laying flat, []  history of blood clot in veins (DVT), []  history of phlebitis, []  swelling in legs, [x]  varicose veins  PULMONARY:  []  productive cough, []  asthma, []  wheezing  NEUROLOGIC:  []  weakness in arms or legs, [x]  numbness in arms or legs, []  difficulty speaking or slurred speech, []  temporary loss of vision in one eye, []  dizziness  HEMATOLOGIC:  []  bleeding problems, []  problems with blood clotting too easily  MUSCULOSKEL:  []  joint pain, []  joint swelling  GASTROINTEST:  []  vomiting blood, []  blood in stool     GENITOURINARY:  []  burning with urination, []  blood in urine  PSYCHIATRIC:  []  history of major depression  INTEGUMENTARY:  []  rashes, []  ulcers  CONSTITUTIONAL:  []  fever, []  chills  Physical Examination Filed Vitals:   08/01/12 1456  BP: 94/65  Pulse: 76  Resp: 16  Height: 5\' 5"  (1.651 m)  Weight: 158 lb (71.668 kg)  SpO2: 100%   Body mass index is 26.29 kg/(m^2).  General: A&O x 3, WDWN  Head: Abie/AT  Ear/Nose/Throat: Hearing grossly intact, nares w/o erythema or drainage, oropharynx w/o Erythema/Exudate  Eyes: PERRLA, EOMI  Neck: Supple, no nuchal rigidity, no palpable LAD  Pulmonary: Sym exp, good air movt, CTAB, no rales, rhonchi, & wheezing  Cardiac: RRR, Nl S1, S2, no Murmurs, rubs or gallops  Vascular: Vessel Right Left  Radial Palpable Palpable  Brachial Palpable Palpable  Carotid Palpable, without bruit Palpable, without bruit  Aorta Not palpable N/A  Femoral Palpable Palpable  Popliteal Not palpable Not palpable  PT Palpable Palpable  DP Palpable Palpable   Gastrointestinal: soft, NTND, -G/R, - HSM, - masses, - CVAT B  Musculoskeletal: M/S 5/5 throughout , Extremities without ischemic changes , R>L varicosities, spider vein in both legs, no LDS  Neurologic: CN 2-12 intact , Pain and light touch intact in extremities , Motor exam as  listed above  Psychiatric: Judgment intact, Mood & affect appropriate for pt's clinical situation  Dermatologic: See M/S exam for extremity exam, no rashes otherwise noted  Lymph : No Cervical, Axillary, or Inguinal lymphadenopathy   Non-Invasive Vascular Imaging  BLE Venous Insufficiency Duplex (Date: 08/01/2012):   RLE: no DVT and SVT, + GSV & SSV reflux, + deep venous reflux  LLE: no DVT and SVT, - GSV reflux, - deep venous reflux  Medical Decision Making  Sabrina Nichols is a 42 y.o. female who presents with: chronic venous insufficiency (C2).   Based on the patient's history and examination, I recommend: B thigh high compression stockings 20-30 mm Hg.  Follow up in 3 months in the Vein clinic for evaluation for possible R GSV EVLA.  Thank you for allowing [place] to participate in this patient's care.  Leonides Sake, MD [facility] [place] Office: [phone] Pager: [phone]  08/01/2012, 5:28 PM

## 2012-11-03 ENCOUNTER — Encounter: Payer: Self-pay | Admitting: Vascular Surgery

## 2012-11-04 ENCOUNTER — Encounter: Payer: Self-pay | Admitting: Vascular Surgery

## 2012-11-04 ENCOUNTER — Encounter (INDEPENDENT_AMBULATORY_CARE_PROVIDER_SITE_OTHER): Payer: Self-pay

## 2012-11-04 ENCOUNTER — Ambulatory Visit (INDEPENDENT_AMBULATORY_CARE_PROVIDER_SITE_OTHER): Payer: BC Managed Care – PPO | Admitting: Vascular Surgery

## 2012-11-04 VITALS — BP 98/67 | HR 85 | Resp 18 | Ht 65.0 in | Wt 160.3 lb

## 2012-11-04 DIAGNOSIS — I83893 Varicose veins of bilateral lower extremities with other complications: Secondary | ICD-10-CM

## 2012-11-04 NOTE — Progress Notes (Signed)
Patient has today for continued discussion regarding her venous hypertension. Her main concern is regarding extensive telangiectasia on both legs and varicosities in the medial aspect of her right calf. She reports that she is having no severe discomfort associated with this. She reports that the compression garments related little and then giving her any relief of tired sensation in her legs. She does not have any history of DVT or bleeding from her varicosities.  Past Medical History  Diagnosis Date  . Varicose veins     History  Substance Use Topics  . Smoking status: Never Smoker   . Smokeless tobacco: Never Used  . Alcohol Use: No    Family History  Problem Relation Age of Onset  . Deep vein thrombosis Mother     Allergies  Allergen Reactions  . Percocet [Oxycodone-Acetaminophen] Nausea And Vomiting    Current outpatient prescriptions:Cholecalciferol (VITAMIN D-3) 5000 UNITS TABS, Take by mouth daily., Disp: , Rfl: ;  DHEA 50 MG CAPS, Take 50 mg by mouth daily., Disp: , Rfl: ;  Multiple Vitamin (MULTIVITAMIN) tablet, Take 1 tablet by mouth daily., Disp: , Rfl: ;  Pregnenolone POWD, 50 mg by Does not apply route. 50 mg Tab- once daily, Disp: , Rfl: ;  Probiotic Product (PROBIOTIC DAILY PO), Take 25 mg by mouth daily., Disp: , Rfl:  vitamin C (ASCORBIC ACID) 250 MG tablet, Take 250 mg by mouth daily., Disp: , Rfl: ;  Zinc 40 MG TABS, Take 45 mg by mouth daily., Disp: , Rfl: ;  glucosamine-chondroitin 500-400 MG tablet, Take 1 tablet by mouth 3 (three) times daily., Disp: , Rfl: ;  L-Methylfolate-B12-B6-B2 (L-METHYL-MC PO), Take 500 mg by mouth daily., Disp: , Rfl: ;  prenatal vitamin w/FE, FA (PRENATAL 1 + 1) 27-1 MG TABS, Take 1 tablet by mouth daily.  , Disp: , Rfl:  S-Adenosylmethionine (SAM-E) 400 MG TBEC, Take by mouth daily., Disp: , Rfl:   BP 98/67  Pulse 85  Resp 18  Ht 5\' 5"  (1.651 m)  Wt 160 lb 4.8 oz (72.712 kg)  BMI 26.68 kg/m2  Body mass index is 26.68  kg/(m^2).       Physical exam: Well-developed female in no acute distress 2+ dorsalis pedis pulses bilaterally Scattered telangiectasia most notably over the lateral thighs bilaterally. She does have some small varicosities in the right medial calf. No changes of chronic venous hypertension  I did reimage her saphenous vein with SonoSite ultrasound. This compares with her formal duplex on her initial visit with [place] in July 2014. She does have enlarged great saphenous vein on the right and no changes on the normal caliber on the left leg. Her formal duplex that showed reflux throughout her great and small saphenous vein on the right.  Impression and plan asymptomatic venous reflux right leg. At a long discussion with the patient explaining I would not recommend any treatment since she is not having any pain associated with this. I did explain that this the likelihood will be progressive over time but that that would not recommend any treatment that she develop symptoms. I she is quite requesting a discussion regarding sclerotherapy for cosmetic improvement of her spider bite telangiectasia. I explained the treatment and potential out-of-pocket cost of this. She will contact Clementeen Hoof, RN in our office to discuss this further

## 2012-11-10 ENCOUNTER — Ambulatory Visit: Payer: BC Managed Care – PPO | Admitting: Psychology

## 2012-11-21 ENCOUNTER — Ambulatory Visit (INDEPENDENT_AMBULATORY_CARE_PROVIDER_SITE_OTHER): Payer: BC Managed Care – PPO | Admitting: Psychology

## 2012-11-21 DIAGNOSIS — F4320 Adjustment disorder, unspecified: Secondary | ICD-10-CM

## 2012-11-25 ENCOUNTER — Other Ambulatory Visit: Payer: Self-pay | Admitting: Obstetrics and Gynecology

## 2012-12-02 ENCOUNTER — Ambulatory Visit: Payer: BC Managed Care – PPO | Admitting: Psychology

## 2012-12-10 ENCOUNTER — Ambulatory Visit (INDEPENDENT_AMBULATORY_CARE_PROVIDER_SITE_OTHER): Payer: BLUE CROSS/BLUE SHIELD | Admitting: Psychology

## 2012-12-10 DIAGNOSIS — F4320 Adjustment disorder, unspecified: Secondary | ICD-10-CM

## 2012-12-17 ENCOUNTER — Ambulatory Visit (INDEPENDENT_AMBULATORY_CARE_PROVIDER_SITE_OTHER): Payer: BC Managed Care – PPO | Admitting: Psychology

## 2012-12-17 DIAGNOSIS — F4320 Adjustment disorder, unspecified: Secondary | ICD-10-CM

## 2012-12-26 ENCOUNTER — Ambulatory Visit: Payer: BC Managed Care – PPO | Admitting: Psychology

## 2013-01-02 ENCOUNTER — Ambulatory Visit (INDEPENDENT_AMBULATORY_CARE_PROVIDER_SITE_OTHER): Payer: BC Managed Care – PPO | Admitting: Psychology

## 2013-01-02 DIAGNOSIS — F4320 Adjustment disorder, unspecified: Secondary | ICD-10-CM

## 2013-01-14 ENCOUNTER — Ambulatory Visit (INDEPENDENT_AMBULATORY_CARE_PROVIDER_SITE_OTHER): Payer: BC Managed Care – PPO | Admitting: Psychology

## 2013-01-14 DIAGNOSIS — F4320 Adjustment disorder, unspecified: Secondary | ICD-10-CM

## 2013-01-30 ENCOUNTER — Ambulatory Visit: Payer: BC Managed Care – PPO | Admitting: Psychology

## 2013-02-02 ENCOUNTER — Ambulatory Visit: Payer: BC Managed Care – PPO | Admitting: Psychology

## 2013-02-09 ENCOUNTER — Ambulatory Visit: Payer: BC Managed Care – PPO | Admitting: Psychology

## 2013-02-24 ENCOUNTER — Ambulatory Visit (INDEPENDENT_AMBULATORY_CARE_PROVIDER_SITE_OTHER): Payer: BC Managed Care – PPO | Admitting: Psychology

## 2013-02-24 DIAGNOSIS — F4320 Adjustment disorder, unspecified: Secondary | ICD-10-CM

## 2013-03-02 ENCOUNTER — Ambulatory Visit: Payer: BC Managed Care – PPO | Admitting: Psychology

## 2013-03-11 ENCOUNTER — Ambulatory Visit: Payer: BC Managed Care – PPO | Admitting: Psychology

## 2013-03-31 ENCOUNTER — Ambulatory Visit (INDEPENDENT_AMBULATORY_CARE_PROVIDER_SITE_OTHER): Payer: BC Managed Care – PPO | Admitting: Psychology

## 2013-03-31 DIAGNOSIS — F4320 Adjustment disorder, unspecified: Secondary | ICD-10-CM

## 2013-04-08 ENCOUNTER — Ambulatory Visit (INDEPENDENT_AMBULATORY_CARE_PROVIDER_SITE_OTHER): Payer: BC Managed Care – PPO | Admitting: Psychology

## 2013-04-08 DIAGNOSIS — F4320 Adjustment disorder, unspecified: Secondary | ICD-10-CM

## 2013-04-20 ENCOUNTER — Ambulatory Visit (INDEPENDENT_AMBULATORY_CARE_PROVIDER_SITE_OTHER): Payer: BC Managed Care – PPO | Admitting: Psychology

## 2013-04-20 DIAGNOSIS — F4320 Adjustment disorder, unspecified: Secondary | ICD-10-CM

## 2013-05-01 ENCOUNTER — Ambulatory Visit (INDEPENDENT_AMBULATORY_CARE_PROVIDER_SITE_OTHER): Payer: BC Managed Care – PPO | Admitting: Psychology

## 2013-05-01 DIAGNOSIS — F4320 Adjustment disorder, unspecified: Secondary | ICD-10-CM

## 2013-05-06 ENCOUNTER — Ambulatory Visit (INDEPENDENT_AMBULATORY_CARE_PROVIDER_SITE_OTHER): Payer: BC Managed Care – PPO | Admitting: Psychology

## 2013-05-06 DIAGNOSIS — F4320 Adjustment disorder, unspecified: Secondary | ICD-10-CM

## 2013-05-15 ENCOUNTER — Ambulatory Visit (INDEPENDENT_AMBULATORY_CARE_PROVIDER_SITE_OTHER): Payer: BC Managed Care – PPO | Admitting: Psychology

## 2013-05-15 DIAGNOSIS — F4320 Adjustment disorder, unspecified: Secondary | ICD-10-CM

## 2013-05-20 ENCOUNTER — Ambulatory Visit: Payer: BC Managed Care – PPO | Admitting: Psychology

## 2013-06-03 ENCOUNTER — Ambulatory Visit: Payer: BC Managed Care – PPO | Admitting: Psychology

## 2013-06-10 ENCOUNTER — Ambulatory Visit (INDEPENDENT_AMBULATORY_CARE_PROVIDER_SITE_OTHER): Payer: BC Managed Care – PPO | Admitting: Psychology

## 2013-06-10 DIAGNOSIS — F4320 Adjustment disorder, unspecified: Secondary | ICD-10-CM

## 2013-06-12 ENCOUNTER — Ambulatory Visit (INDEPENDENT_AMBULATORY_CARE_PROVIDER_SITE_OTHER): Payer: BC Managed Care – PPO | Admitting: Psychology

## 2013-06-12 DIAGNOSIS — F4320 Adjustment disorder, unspecified: Secondary | ICD-10-CM

## 2013-07-01 ENCOUNTER — Ambulatory Visit (INDEPENDENT_AMBULATORY_CARE_PROVIDER_SITE_OTHER): Payer: BC Managed Care – PPO | Admitting: Psychology

## 2013-07-01 DIAGNOSIS — F4320 Adjustment disorder, unspecified: Secondary | ICD-10-CM

## 2013-07-09 ENCOUNTER — Ambulatory Visit: Payer: BC Managed Care – PPO | Admitting: Psychology

## 2013-07-14 ENCOUNTER — Ambulatory Visit (INDEPENDENT_AMBULATORY_CARE_PROVIDER_SITE_OTHER): Payer: BC Managed Care – PPO | Admitting: Psychology

## 2013-07-14 DIAGNOSIS — F4320 Adjustment disorder, unspecified: Secondary | ICD-10-CM

## 2013-07-16 ENCOUNTER — Ambulatory Visit: Payer: BC Managed Care – PPO | Admitting: Psychology

## 2013-08-06 ENCOUNTER — Ambulatory Visit: Payer: BC Managed Care – PPO | Admitting: Psychology

## 2013-08-11 ENCOUNTER — Ambulatory Visit (INDEPENDENT_AMBULATORY_CARE_PROVIDER_SITE_OTHER): Payer: BC Managed Care – PPO | Admitting: Psychology

## 2013-08-11 DIAGNOSIS — F4320 Adjustment disorder, unspecified: Secondary | ICD-10-CM

## 2013-08-18 ENCOUNTER — Ambulatory Visit (INDEPENDENT_AMBULATORY_CARE_PROVIDER_SITE_OTHER): Payer: BC Managed Care – PPO | Admitting: Psychology

## 2013-08-18 DIAGNOSIS — F4320 Adjustment disorder, unspecified: Secondary | ICD-10-CM

## 2013-11-10 ENCOUNTER — Ambulatory Visit (INDEPENDENT_AMBULATORY_CARE_PROVIDER_SITE_OTHER): Payer: BC Managed Care – PPO | Admitting: Psychology

## 2013-11-10 DIAGNOSIS — F348 Other persistent mood [affective] disorders: Secondary | ICD-10-CM

## 2013-11-16 ENCOUNTER — Encounter: Payer: Self-pay | Admitting: Vascular Surgery

## 2013-11-19 ENCOUNTER — Ambulatory Visit: Payer: BC Managed Care – PPO | Admitting: Psychology

## 2013-11-26 ENCOUNTER — Ambulatory Visit (INDEPENDENT_AMBULATORY_CARE_PROVIDER_SITE_OTHER): Payer: BC Managed Care – PPO | Admitting: Psychology

## 2013-11-26 DIAGNOSIS — F348 Other persistent mood [affective] disorders: Secondary | ICD-10-CM

## 2013-12-01 ENCOUNTER — Other Ambulatory Visit: Payer: Self-pay | Admitting: Obstetrics and Gynecology

## 2013-12-02 ENCOUNTER — Ambulatory Visit (INDEPENDENT_AMBULATORY_CARE_PROVIDER_SITE_OTHER): Payer: BC Managed Care – PPO | Admitting: Psychology

## 2013-12-02 DIAGNOSIS — F348 Other persistent mood [affective] disorders: Secondary | ICD-10-CM

## 2013-12-03 LAB — CYTOLOGY - PAP

## 2013-12-14 ENCOUNTER — Ambulatory Visit: Payer: BC Managed Care – PPO | Admitting: Psychology

## 2013-12-16 ENCOUNTER — Ambulatory Visit (INDEPENDENT_AMBULATORY_CARE_PROVIDER_SITE_OTHER): Payer: BC Managed Care – PPO | Admitting: Psychology

## 2013-12-16 DIAGNOSIS — F348 Other persistent mood [affective] disorders: Secondary | ICD-10-CM

## 2013-12-29 ENCOUNTER — Ambulatory Visit: Payer: BC Managed Care – PPO | Admitting: Psychology

## 2013-12-31 ENCOUNTER — Ambulatory Visit (INDEPENDENT_AMBULATORY_CARE_PROVIDER_SITE_OTHER): Payer: BC Managed Care – PPO | Admitting: Psychology

## 2013-12-31 DIAGNOSIS — F348 Other persistent mood [affective] disorders: Secondary | ICD-10-CM

## 2014-03-03 ENCOUNTER — Ambulatory Visit (INDEPENDENT_AMBULATORY_CARE_PROVIDER_SITE_OTHER): Payer: BLUE CROSS/BLUE SHIELD | Admitting: Psychology

## 2014-03-03 DIAGNOSIS — F348 Other persistent mood [affective] disorders: Secondary | ICD-10-CM

## 2014-03-08 ENCOUNTER — Ambulatory Visit (INDEPENDENT_AMBULATORY_CARE_PROVIDER_SITE_OTHER): Payer: BLUE CROSS/BLUE SHIELD | Admitting: Psychology

## 2014-03-08 DIAGNOSIS — F348 Other persistent mood [affective] disorders: Secondary | ICD-10-CM

## 2014-03-12 ENCOUNTER — Ambulatory Visit: Payer: Self-pay | Admitting: Psychology

## 2014-03-15 ENCOUNTER — Ambulatory Visit: Payer: BLUE CROSS/BLUE SHIELD | Admitting: Psychology

## 2014-03-22 ENCOUNTER — Ambulatory Visit (INDEPENDENT_AMBULATORY_CARE_PROVIDER_SITE_OTHER): Payer: BLUE CROSS/BLUE SHIELD | Admitting: Psychology

## 2014-03-22 DIAGNOSIS — F348 Other persistent mood [affective] disorders: Secondary | ICD-10-CM

## 2014-03-29 ENCOUNTER — Ambulatory Visit (INDEPENDENT_AMBULATORY_CARE_PROVIDER_SITE_OTHER): Payer: BLUE CROSS/BLUE SHIELD | Admitting: Psychology

## 2014-03-29 DIAGNOSIS — F348 Other persistent mood [affective] disorders: Secondary | ICD-10-CM

## 2014-08-06 ENCOUNTER — Ambulatory Visit (INDEPENDENT_AMBULATORY_CARE_PROVIDER_SITE_OTHER): Payer: BLUE CROSS/BLUE SHIELD | Admitting: Psychology

## 2014-08-06 ENCOUNTER — Ambulatory Visit: Payer: BLUE CROSS/BLUE SHIELD | Admitting: Psychology

## 2014-08-06 DIAGNOSIS — F348 Other persistent mood [affective] disorders: Secondary | ICD-10-CM | POA: Diagnosis not present

## 2014-08-13 ENCOUNTER — Ambulatory Visit (INDEPENDENT_AMBULATORY_CARE_PROVIDER_SITE_OTHER): Payer: BLUE CROSS/BLUE SHIELD | Admitting: Psychology

## 2014-08-13 DIAGNOSIS — F348 Other persistent mood [affective] disorders: Secondary | ICD-10-CM

## 2014-08-19 ENCOUNTER — Ambulatory Visit: Payer: BLUE CROSS/BLUE SHIELD | Admitting: Psychology

## 2014-08-27 ENCOUNTER — Ambulatory Visit: Payer: BLUE CROSS/BLUE SHIELD | Admitting: Psychology

## 2014-09-06 ENCOUNTER — Ambulatory Visit (INDEPENDENT_AMBULATORY_CARE_PROVIDER_SITE_OTHER): Payer: BLUE CROSS/BLUE SHIELD | Admitting: Psychology

## 2014-09-06 DIAGNOSIS — F348 Other persistent mood [affective] disorders: Secondary | ICD-10-CM

## 2014-09-13 ENCOUNTER — Ambulatory Visit (INDEPENDENT_AMBULATORY_CARE_PROVIDER_SITE_OTHER): Payer: BLUE CROSS/BLUE SHIELD | Admitting: Psychology

## 2014-09-13 DIAGNOSIS — F348 Other persistent mood [affective] disorders: Secondary | ICD-10-CM

## 2014-09-14 ENCOUNTER — Ambulatory Visit: Payer: BLUE CROSS/BLUE SHIELD | Admitting: Psychology

## 2014-09-15 ENCOUNTER — Ambulatory Visit (INDEPENDENT_AMBULATORY_CARE_PROVIDER_SITE_OTHER): Payer: BLUE CROSS/BLUE SHIELD | Admitting: Psychology

## 2014-09-15 DIAGNOSIS — F348 Other persistent mood [affective] disorders: Secondary | ICD-10-CM | POA: Diagnosis not present

## 2014-09-21 ENCOUNTER — Ambulatory Visit (INDEPENDENT_AMBULATORY_CARE_PROVIDER_SITE_OTHER): Payer: BLUE CROSS/BLUE SHIELD | Admitting: Psychology

## 2014-09-21 DIAGNOSIS — F348 Other persistent mood [affective] disorders: Secondary | ICD-10-CM

## 2014-10-01 ENCOUNTER — Ambulatory Visit (INDEPENDENT_AMBULATORY_CARE_PROVIDER_SITE_OTHER): Payer: BLUE CROSS/BLUE SHIELD | Admitting: Psychology

## 2014-10-01 DIAGNOSIS — F348 Other persistent mood [affective] disorders: Secondary | ICD-10-CM | POA: Diagnosis not present

## 2014-10-12 ENCOUNTER — Ambulatory Visit (INDEPENDENT_AMBULATORY_CARE_PROVIDER_SITE_OTHER): Payer: BLUE CROSS/BLUE SHIELD | Admitting: Psychology

## 2014-10-12 DIAGNOSIS — F348 Other persistent mood [affective] disorders: Secondary | ICD-10-CM | POA: Diagnosis not present

## 2014-10-13 ENCOUNTER — Ambulatory Visit: Payer: BLUE CROSS/BLUE SHIELD | Admitting: Psychology

## 2014-10-19 ENCOUNTER — Ambulatory Visit (INDEPENDENT_AMBULATORY_CARE_PROVIDER_SITE_OTHER): Payer: BLUE CROSS/BLUE SHIELD | Admitting: Psychology

## 2014-10-19 DIAGNOSIS — F3489 Other specified persistent mood disorders: Secondary | ICD-10-CM | POA: Diagnosis not present

## 2014-10-26 ENCOUNTER — Ambulatory Visit: Payer: BLUE CROSS/BLUE SHIELD | Admitting: Psychology

## 2014-10-27 ENCOUNTER — Ambulatory Visit: Payer: BLUE CROSS/BLUE SHIELD | Admitting: Psychology

## 2014-11-03 ENCOUNTER — Ambulatory Visit: Payer: BLUE CROSS/BLUE SHIELD | Admitting: Psychology

## 2014-11-12 ENCOUNTER — Ambulatory Visit (INDEPENDENT_AMBULATORY_CARE_PROVIDER_SITE_OTHER): Payer: BLUE CROSS/BLUE SHIELD | Admitting: Psychology

## 2014-11-12 DIAGNOSIS — F3489 Other specified persistent mood disorders: Secondary | ICD-10-CM | POA: Diagnosis not present

## 2014-11-18 ENCOUNTER — Ambulatory Visit (INDEPENDENT_AMBULATORY_CARE_PROVIDER_SITE_OTHER): Payer: BLUE CROSS/BLUE SHIELD | Admitting: Psychology

## 2014-11-18 DIAGNOSIS — F3489 Other specified persistent mood disorders: Secondary | ICD-10-CM

## 2014-11-22 ENCOUNTER — Ambulatory Visit: Payer: BLUE CROSS/BLUE SHIELD | Admitting: Psychology

## 2014-11-23 ENCOUNTER — Ambulatory Visit (INDEPENDENT_AMBULATORY_CARE_PROVIDER_SITE_OTHER): Payer: BLUE CROSS/BLUE SHIELD | Admitting: Psychology

## 2014-11-23 DIAGNOSIS — F3489 Other specified persistent mood disorders: Secondary | ICD-10-CM | POA: Diagnosis not present

## 2014-11-25 ENCOUNTER — Ambulatory Visit: Payer: BLUE CROSS/BLUE SHIELD | Admitting: Psychology

## 2014-11-30 ENCOUNTER — Ambulatory Visit (INDEPENDENT_AMBULATORY_CARE_PROVIDER_SITE_OTHER): Payer: BLUE CROSS/BLUE SHIELD | Admitting: Psychology

## 2014-11-30 DIAGNOSIS — F3489 Other specified persistent mood disorders: Secondary | ICD-10-CM

## 2014-12-02 ENCOUNTER — Ambulatory Visit (INDEPENDENT_AMBULATORY_CARE_PROVIDER_SITE_OTHER): Payer: BLUE CROSS/BLUE SHIELD | Admitting: Psychology

## 2014-12-02 DIAGNOSIS — F3489 Other specified persistent mood disorders: Secondary | ICD-10-CM

## 2014-12-06 ENCOUNTER — Ambulatory Visit (INDEPENDENT_AMBULATORY_CARE_PROVIDER_SITE_OTHER): Payer: BLUE CROSS/BLUE SHIELD | Admitting: Psychology

## 2014-12-06 DIAGNOSIS — F3489 Other specified persistent mood disorders: Secondary | ICD-10-CM | POA: Diagnosis not present

## 2014-12-08 ENCOUNTER — Ambulatory Visit (INDEPENDENT_AMBULATORY_CARE_PROVIDER_SITE_OTHER): Payer: BLUE CROSS/BLUE SHIELD | Admitting: Psychology

## 2014-12-08 DIAGNOSIS — F3489 Other specified persistent mood disorders: Secondary | ICD-10-CM | POA: Diagnosis not present

## 2014-12-13 ENCOUNTER — Ambulatory Visit (INDEPENDENT_AMBULATORY_CARE_PROVIDER_SITE_OTHER): Payer: BLUE CROSS/BLUE SHIELD | Admitting: Psychology

## 2014-12-13 DIAGNOSIS — F3489 Other specified persistent mood disorders: Secondary | ICD-10-CM | POA: Diagnosis not present

## 2014-12-20 ENCOUNTER — Ambulatory Visit (INDEPENDENT_AMBULATORY_CARE_PROVIDER_SITE_OTHER): Payer: BLUE CROSS/BLUE SHIELD | Admitting: Psychology

## 2014-12-20 DIAGNOSIS — F3489 Other specified persistent mood disorders: Secondary | ICD-10-CM

## 2014-12-23 ENCOUNTER — Ambulatory Visit: Payer: BLUE CROSS/BLUE SHIELD | Admitting: Psychology

## 2014-12-27 ENCOUNTER — Ambulatory Visit: Payer: BLUE CROSS/BLUE SHIELD | Admitting: Psychology

## 2014-12-29 ENCOUNTER — Ambulatory Visit: Payer: BLUE CROSS/BLUE SHIELD | Admitting: Psychology

## 2015-01-06 ENCOUNTER — Ambulatory Visit: Payer: BLUE CROSS/BLUE SHIELD | Admitting: Psychology

## 2015-01-13 ENCOUNTER — Ambulatory Visit: Payer: BLUE CROSS/BLUE SHIELD | Admitting: Psychology

## 2015-02-10 ENCOUNTER — Ambulatory Visit: Payer: BLUE CROSS/BLUE SHIELD | Admitting: Psychology

## 2015-04-06 ENCOUNTER — Ambulatory Visit: Payer: BLUE CROSS/BLUE SHIELD | Admitting: Psychology

## 2015-04-14 ENCOUNTER — Ambulatory Visit (INDEPENDENT_AMBULATORY_CARE_PROVIDER_SITE_OTHER): Payer: BLUE CROSS/BLUE SHIELD | Admitting: Psychology

## 2015-04-14 DIAGNOSIS — F3481 Disruptive mood dysregulation disorder: Secondary | ICD-10-CM

## 2015-04-15 ENCOUNTER — Ambulatory Visit: Payer: BLUE CROSS/BLUE SHIELD | Admitting: Psychology

## 2015-04-19 ENCOUNTER — Ambulatory Visit: Payer: BLUE CROSS/BLUE SHIELD | Admitting: Psychology

## 2015-04-20 ENCOUNTER — Ambulatory Visit (INDEPENDENT_AMBULATORY_CARE_PROVIDER_SITE_OTHER): Payer: BLUE CROSS/BLUE SHIELD | Admitting: Psychology

## 2015-04-20 DIAGNOSIS — F3481 Disruptive mood dysregulation disorder: Secondary | ICD-10-CM

## 2015-05-31 ENCOUNTER — Ambulatory Visit: Payer: BLUE CROSS/BLUE SHIELD | Admitting: Psychology

## 2015-06-03 ENCOUNTER — Ambulatory Visit: Payer: BLUE CROSS/BLUE SHIELD | Admitting: Psychology

## 2015-06-28 ENCOUNTER — Ambulatory Visit (INDEPENDENT_AMBULATORY_CARE_PROVIDER_SITE_OTHER): Payer: BLUE CROSS/BLUE SHIELD | Admitting: Psychology

## 2015-06-28 DIAGNOSIS — F411 Generalized anxiety disorder: Secondary | ICD-10-CM | POA: Diagnosis not present

## 2015-07-04 ENCOUNTER — Ambulatory Visit (INDEPENDENT_AMBULATORY_CARE_PROVIDER_SITE_OTHER): Payer: BLUE CROSS/BLUE SHIELD | Admitting: Psychology

## 2015-07-04 DIAGNOSIS — F3481 Disruptive mood dysregulation disorder: Secondary | ICD-10-CM

## 2015-07-13 ENCOUNTER — Ambulatory Visit: Payer: BLUE CROSS/BLUE SHIELD | Admitting: Psychology

## 2015-07-29 ENCOUNTER — Ambulatory Visit (INDEPENDENT_AMBULATORY_CARE_PROVIDER_SITE_OTHER): Payer: BLUE CROSS/BLUE SHIELD | Admitting: Psychology

## 2015-07-29 DIAGNOSIS — F4323 Adjustment disorder with mixed anxiety and depressed mood: Secondary | ICD-10-CM | POA: Diagnosis not present

## 2015-08-24 ENCOUNTER — Ambulatory Visit (INDEPENDENT_AMBULATORY_CARE_PROVIDER_SITE_OTHER): Payer: BLUE CROSS/BLUE SHIELD

## 2015-08-24 ENCOUNTER — Ambulatory Visit (INDEPENDENT_AMBULATORY_CARE_PROVIDER_SITE_OTHER): Payer: BLUE CROSS/BLUE SHIELD | Admitting: Podiatry

## 2015-08-24 ENCOUNTER — Encounter: Payer: Self-pay | Admitting: Podiatry

## 2015-08-24 VITALS — BP 108/65 | HR 75 | Resp 16 | Ht 65.0 in | Wt 160.0 lb

## 2015-08-24 DIAGNOSIS — L259 Unspecified contact dermatitis, unspecified cause: Secondary | ICD-10-CM | POA: Diagnosis not present

## 2015-08-24 DIAGNOSIS — M79671 Pain in right foot: Secondary | ICD-10-CM | POA: Diagnosis not present

## 2015-08-24 DIAGNOSIS — M779 Enthesopathy, unspecified: Secondary | ICD-10-CM | POA: Diagnosis not present

## 2015-08-24 MED ORDER — TERBINAFINE HCL 250 MG PO TABS: ORAL_TABLET | ORAL | 0 refills | Status: DC

## 2015-08-24 NOTE — Progress Notes (Signed)
   Subjective:    Patient ID: Sabrina Nichols, female    DOB: 07/31/1970, 45 y.o.   MRN: [id number]  HPI  Chief Complaint  Patient presents with  . Foot Pain    Right foot; lateral side; pt stated, "has had pain on and off for past 6 months"  . Skin Condition    Left foot-medial (near heel) & Dorsal (below great toe); pt stated, "Saw Dr. Emily FilbertGould, dermatologist on Jun 01, 2015 took a sample of tissue and nothing was wrong"     Review of Systems  All other systems reviewed and are negative.      Objective:   Physical Exam        Assessment & Plan:

## 2015-08-25 NOTE — Progress Notes (Signed)
Subjective:     Patient ID: Sabrina Nichols, female   DOB: 06/11/1970, 45 y.o.   MRN: [id number]  HPI patient presents stating she's had some pain in the outside of her right foot and prefers not to use medication and also is had several areas on the left foot that it had bumps and it's a lot with the big toe of the arch being most affected   Review of Systems  All other systems reviewed and are negative.      Objective:   Physical Exam  Constitutional: She is oriented to person, place, and time.  Cardiovascular: Intact distal pulses.   Musculoskeletal: Normal range of motion.  Neurological: She is oriented to person, place, and time.  Skin: Skin is warm.  Nursing note and vitals reviewed.  neurovascular status intact muscle strength adequate range of motion is within normal limits with patient found to have some irritative read blisters on the mid arch area left and around the big toe joint left with the right foot showing discomfort in the lateral side of the foot around the peroneal complex. Patient does not have any other disease process is noted and is found to have good digital perfusion and is well oriented 3     Assessment:     Tendinitis lateral side right foot consistent with peroneal disease and also probable fungal type skin irritation of the mid arch area left and the left big toe    Plan:     H&P and conditions reviewed. She will start Lamisil cream and if she doesn't have significant improvement we'll do a Lamisil pulse pack and today I did go ahead and applied a fascial brace right to lift up the lateral side of the foot advised on aggressive ice and if symptoms persist we'll consider injection  X-ray right negative for signs of fracture stress fracture or arthritic processes

## 2015-09-02 ENCOUNTER — Ambulatory Visit (INDEPENDENT_AMBULATORY_CARE_PROVIDER_SITE_OTHER): Payer: BLUE CROSS/BLUE SHIELD | Admitting: Psychology

## 2015-09-02 DIAGNOSIS — F3481 Disruptive mood dysregulation disorder: Secondary | ICD-10-CM | POA: Diagnosis not present

## 2015-09-09 ENCOUNTER — Ambulatory Visit (INDEPENDENT_AMBULATORY_CARE_PROVIDER_SITE_OTHER): Payer: BLUE CROSS/BLUE SHIELD | Admitting: Psychology

## 2015-09-09 DIAGNOSIS — F4320 Adjustment disorder, unspecified: Secondary | ICD-10-CM

## 2015-09-13 ENCOUNTER — Ambulatory Visit (INDEPENDENT_AMBULATORY_CARE_PROVIDER_SITE_OTHER): Payer: BLUE CROSS/BLUE SHIELD | Admitting: Psychology

## 2015-09-13 DIAGNOSIS — F3481 Disruptive mood dysregulation disorder: Secondary | ICD-10-CM | POA: Diagnosis not present

## 2015-09-21 ENCOUNTER — Ambulatory Visit: Payer: BLUE CROSS/BLUE SHIELD | Admitting: Psychology

## 2015-10-07 ENCOUNTER — Ambulatory Visit (INDEPENDENT_AMBULATORY_CARE_PROVIDER_SITE_OTHER): Payer: BLUE CROSS/BLUE SHIELD | Admitting: Psychology

## 2015-10-07 DIAGNOSIS — F3481 Disruptive mood dysregulation disorder: Secondary | ICD-10-CM

## 2015-10-17 ENCOUNTER — Ambulatory Visit: Payer: BLUE CROSS/BLUE SHIELD | Admitting: Psychology

## 2015-10-26 ENCOUNTER — Ambulatory Visit: Payer: BLUE CROSS/BLUE SHIELD | Admitting: Psychology

## 2015-10-31 ENCOUNTER — Ambulatory Visit (INDEPENDENT_AMBULATORY_CARE_PROVIDER_SITE_OTHER): Payer: BLUE CROSS/BLUE SHIELD | Admitting: Psychology

## 2015-10-31 DIAGNOSIS — F3489 Other specified persistent mood disorders: Secondary | ICD-10-CM | POA: Diagnosis not present

## 2015-11-08 DIAGNOSIS — E559 Vitamin D deficiency, unspecified: Secondary | ICD-10-CM | POA: Diagnosis not present

## 2015-11-08 DIAGNOSIS — R6882 Decreased libido: Secondary | ICD-10-CM | POA: Diagnosis not present

## 2015-11-08 DIAGNOSIS — R7303 Prediabetes: Secondary | ICD-10-CM | POA: Diagnosis not present

## 2015-11-08 DIAGNOSIS — R5381 Other malaise: Secondary | ICD-10-CM | POA: Diagnosis not present

## 2015-11-10 ENCOUNTER — Ambulatory Visit (INDEPENDENT_AMBULATORY_CARE_PROVIDER_SITE_OTHER): Payer: BLUE CROSS/BLUE SHIELD | Admitting: Psychology

## 2015-11-10 DIAGNOSIS — F3489 Other specified persistent mood disorders: Secondary | ICD-10-CM

## 2015-11-14 ENCOUNTER — Ambulatory Visit: Payer: BLUE CROSS/BLUE SHIELD | Admitting: Psychology

## 2015-11-18 ENCOUNTER — Ambulatory Visit: Payer: BLUE CROSS/BLUE SHIELD | Admitting: Psychology

## 2015-11-23 DIAGNOSIS — R6882 Decreased libido: Secondary | ICD-10-CM | POA: Diagnosis not present

## 2015-11-23 DIAGNOSIS — F411 Generalized anxiety disorder: Secondary | ICD-10-CM | POA: Diagnosis not present

## 2015-11-23 DIAGNOSIS — R7303 Prediabetes: Secondary | ICD-10-CM | POA: Diagnosis not present

## 2015-12-01 DIAGNOSIS — F411 Generalized anxiety disorder: Secondary | ICD-10-CM | POA: Diagnosis not present

## 2015-12-13 DIAGNOSIS — F411 Generalized anxiety disorder: Secondary | ICD-10-CM | POA: Diagnosis not present

## 2015-12-19 DIAGNOSIS — F411 Generalized anxiety disorder: Secondary | ICD-10-CM | POA: Diagnosis not present

## 2015-12-26 DIAGNOSIS — Z6828 Body mass index (BMI) 28.0-28.9, adult: Secondary | ICD-10-CM | POA: Diagnosis not present

## 2015-12-26 DIAGNOSIS — Z1231 Encounter for screening mammogram for malignant neoplasm of breast: Secondary | ICD-10-CM | POA: Diagnosis not present

## 2015-12-26 DIAGNOSIS — Z01419 Encounter for gynecological examination (general) (routine) without abnormal findings: Secondary | ICD-10-CM | POA: Diagnosis not present

## 2015-12-26 DIAGNOSIS — Z1212 Encounter for screening for malignant neoplasm of rectum: Secondary | ICD-10-CM | POA: Diagnosis not present

## 2016-01-02 DIAGNOSIS — F411 Generalized anxiety disorder: Secondary | ICD-10-CM | POA: Diagnosis not present

## 2016-01-18 DIAGNOSIS — F411 Generalized anxiety disorder: Secondary | ICD-10-CM | POA: Diagnosis not present

## 2016-02-13 DIAGNOSIS — F411 Generalized anxiety disorder: Secondary | ICD-10-CM | POA: Diagnosis not present

## 2016-02-15 DIAGNOSIS — I8312 Varicose veins of left lower extremity with inflammation: Secondary | ICD-10-CM | POA: Diagnosis not present

## 2016-03-26 DIAGNOSIS — R112 Nausea with vomiting, unspecified: Secondary | ICD-10-CM | POA: Diagnosis not present

## 2016-05-15 DIAGNOSIS — N760 Acute vaginitis: Secondary | ICD-10-CM | POA: Diagnosis not present

## 2016-06-07 DIAGNOSIS — D2272 Melanocytic nevi of left lower limb, including hip: Secondary | ICD-10-CM | POA: Diagnosis not present

## 2016-06-07 DIAGNOSIS — D225 Melanocytic nevi of trunk: Secondary | ICD-10-CM | POA: Diagnosis not present

## 2016-06-07 DIAGNOSIS — D2271 Melanocytic nevi of right lower limb, including hip: Secondary | ICD-10-CM | POA: Diagnosis not present

## 2016-06-07 DIAGNOSIS — L304 Erythema intertrigo: Secondary | ICD-10-CM | POA: Diagnosis not present

## 2016-06-13 DIAGNOSIS — F4323 Adjustment disorder with mixed anxiety and depressed mood: Secondary | ICD-10-CM | POA: Diagnosis not present

## 2016-06-19 DIAGNOSIS — F4310 Post-traumatic stress disorder, unspecified: Secondary | ICD-10-CM | POA: Diagnosis not present

## 2016-06-28 DIAGNOSIS — F4310 Post-traumatic stress disorder, unspecified: Secondary | ICD-10-CM | POA: Diagnosis not present

## 2016-07-09 DIAGNOSIS — F4323 Adjustment disorder with mixed anxiety and depressed mood: Secondary | ICD-10-CM | POA: Diagnosis not present

## 2016-07-24 DIAGNOSIS — F4310 Post-traumatic stress disorder, unspecified: Secondary | ICD-10-CM | POA: Diagnosis not present

## 2016-08-01 DIAGNOSIS — F4310 Post-traumatic stress disorder, unspecified: Secondary | ICD-10-CM | POA: Diagnosis not present

## 2016-08-06 DIAGNOSIS — F411 Generalized anxiety disorder: Secondary | ICD-10-CM | POA: Diagnosis not present

## 2016-08-07 DIAGNOSIS — F4310 Post-traumatic stress disorder, unspecified: Secondary | ICD-10-CM | POA: Diagnosis not present

## 2016-08-07 DIAGNOSIS — F411 Generalized anxiety disorder: Secondary | ICD-10-CM | POA: Diagnosis not present

## 2016-08-22 DIAGNOSIS — F411 Generalized anxiety disorder: Secondary | ICD-10-CM | POA: Diagnosis not present

## 2016-08-23 DIAGNOSIS — F4310 Post-traumatic stress disorder, unspecified: Secondary | ICD-10-CM | POA: Diagnosis not present

## 2016-08-28 DIAGNOSIS — F4310 Post-traumatic stress disorder, unspecified: Secondary | ICD-10-CM | POA: Diagnosis not present

## 2016-08-29 DIAGNOSIS — F411 Generalized anxiety disorder: Secondary | ICD-10-CM | POA: Diagnosis not present

## 2016-09-04 DIAGNOSIS — F411 Generalized anxiety disorder: Secondary | ICD-10-CM | POA: Diagnosis not present

## 2016-09-05 DIAGNOSIS — F4310 Post-traumatic stress disorder, unspecified: Secondary | ICD-10-CM | POA: Diagnosis not present

## 2016-09-10 DIAGNOSIS — F411 Generalized anxiety disorder: Secondary | ICD-10-CM | POA: Diagnosis not present

## 2016-09-11 DIAGNOSIS — F411 Generalized anxiety disorder: Secondary | ICD-10-CM | POA: Diagnosis not present

## 2016-09-20 DIAGNOSIS — F411 Generalized anxiety disorder: Secondary | ICD-10-CM | POA: Diagnosis not present

## 2016-09-26 DIAGNOSIS — F411 Generalized anxiety disorder: Secondary | ICD-10-CM | POA: Diagnosis not present

## 2016-09-26 DIAGNOSIS — I8312 Varicose veins of left lower extremity with inflammation: Secondary | ICD-10-CM | POA: Diagnosis not present

## 2016-10-02 DIAGNOSIS — F411 Generalized anxiety disorder: Secondary | ICD-10-CM | POA: Diagnosis not present

## 2016-10-09 DIAGNOSIS — F4320 Adjustment disorder, unspecified: Secondary | ICD-10-CM | POA: Diagnosis not present

## 2016-10-10 DIAGNOSIS — F411 Generalized anxiety disorder: Secondary | ICD-10-CM | POA: Diagnosis not present

## 2016-10-16 DIAGNOSIS — F411 Generalized anxiety disorder: Secondary | ICD-10-CM | POA: Diagnosis not present

## 2016-10-18 DIAGNOSIS — F4320 Adjustment disorder, unspecified: Secondary | ICD-10-CM | POA: Diagnosis not present

## 2016-10-19 DIAGNOSIS — I8312 Varicose veins of left lower extremity with inflammation: Secondary | ICD-10-CM | POA: Diagnosis not present

## 2016-10-23 DIAGNOSIS — F411 Generalized anxiety disorder: Secondary | ICD-10-CM | POA: Diagnosis not present

## 2016-11-01 DIAGNOSIS — F411 Generalized anxiety disorder: Secondary | ICD-10-CM | POA: Diagnosis not present

## 2016-11-07 DIAGNOSIS — I8311 Varicose veins of right lower extremity with inflammation: Secondary | ICD-10-CM | POA: Diagnosis not present

## 2016-11-08 DIAGNOSIS — F4320 Adjustment disorder, unspecified: Secondary | ICD-10-CM | POA: Diagnosis not present

## 2016-11-09 DIAGNOSIS — I8311 Varicose veins of right lower extremity with inflammation: Secondary | ICD-10-CM | POA: Diagnosis not present

## 2016-11-09 DIAGNOSIS — F411 Generalized anxiety disorder: Secondary | ICD-10-CM | POA: Diagnosis not present

## 2016-11-15 DIAGNOSIS — F411 Generalized anxiety disorder: Secondary | ICD-10-CM | POA: Diagnosis not present

## 2016-11-20 DIAGNOSIS — I8312 Varicose veins of left lower extremity with inflammation: Secondary | ICD-10-CM | POA: Diagnosis not present

## 2016-11-20 DIAGNOSIS — I8311 Varicose veins of right lower extremity with inflammation: Secondary | ICD-10-CM | POA: Diagnosis not present

## 2016-11-23 DIAGNOSIS — I8311 Varicose veins of right lower extremity with inflammation: Secondary | ICD-10-CM | POA: Diagnosis not present

## 2016-11-27 DIAGNOSIS — F411 Generalized anxiety disorder: Secondary | ICD-10-CM | POA: Diagnosis not present

## 2016-11-27 DIAGNOSIS — R1013 Epigastric pain: Secondary | ICD-10-CM | POA: Diagnosis not present

## 2016-11-28 DIAGNOSIS — F411 Generalized anxiety disorder: Secondary | ICD-10-CM | POA: Diagnosis not present

## 2016-12-05 DIAGNOSIS — F411 Generalized anxiety disorder: Secondary | ICD-10-CM | POA: Diagnosis not present

## 2016-12-12 DIAGNOSIS — F411 Generalized anxiety disorder: Secondary | ICD-10-CM | POA: Diagnosis not present

## 2016-12-13 DIAGNOSIS — M7981 Nontraumatic hematoma of soft tissue: Secondary | ICD-10-CM | POA: Diagnosis not present

## 2016-12-13 DIAGNOSIS — I8311 Varicose veins of right lower extremity with inflammation: Secondary | ICD-10-CM | POA: Diagnosis not present

## 2016-12-17 DIAGNOSIS — F411 Generalized anxiety disorder: Secondary | ICD-10-CM | POA: Diagnosis not present

## 2016-12-25 DIAGNOSIS — I8312 Varicose veins of left lower extremity with inflammation: Secondary | ICD-10-CM | POA: Diagnosis not present

## 2016-12-25 DIAGNOSIS — M7981 Nontraumatic hematoma of soft tissue: Secondary | ICD-10-CM | POA: Diagnosis not present

## 2016-12-28 DIAGNOSIS — M9902 Segmental and somatic dysfunction of thoracic region: Secondary | ICD-10-CM | POA: Diagnosis not present

## 2016-12-28 DIAGNOSIS — M791 Myalgia, unspecified site: Secondary | ICD-10-CM | POA: Diagnosis not present

## 2016-12-28 DIAGNOSIS — R51 Headache: Secondary | ICD-10-CM | POA: Diagnosis not present

## 2016-12-28 DIAGNOSIS — M9901 Segmental and somatic dysfunction of cervical region: Secondary | ICD-10-CM | POA: Diagnosis not present

## 2017-01-01 DIAGNOSIS — M9901 Segmental and somatic dysfunction of cervical region: Secondary | ICD-10-CM | POA: Diagnosis not present

## 2017-01-01 DIAGNOSIS — R51 Headache: Secondary | ICD-10-CM | POA: Diagnosis not present

## 2017-01-01 DIAGNOSIS — M791 Myalgia, unspecified site: Secondary | ICD-10-CM | POA: Diagnosis not present

## 2017-01-01 DIAGNOSIS — M9902 Segmental and somatic dysfunction of thoracic region: Secondary | ICD-10-CM | POA: Diagnosis not present

## 2017-01-02 DIAGNOSIS — F411 Generalized anxiety disorder: Secondary | ICD-10-CM | POA: Diagnosis not present

## 2017-01-10 DIAGNOSIS — F411 Generalized anxiety disorder: Secondary | ICD-10-CM | POA: Diagnosis not present

## 2017-01-10 DIAGNOSIS — M7981 Nontraumatic hematoma of soft tissue: Secondary | ICD-10-CM | POA: Diagnosis not present

## 2017-01-10 DIAGNOSIS — I8312 Varicose veins of left lower extremity with inflammation: Secondary | ICD-10-CM | POA: Diagnosis not present

## 2017-01-16 DIAGNOSIS — F411 Generalized anxiety disorder: Secondary | ICD-10-CM | POA: Diagnosis not present

## 2017-01-18 DIAGNOSIS — F411 Generalized anxiety disorder: Secondary | ICD-10-CM | POA: Diagnosis not present

## 2017-01-24 DIAGNOSIS — F411 Generalized anxiety disorder: Secondary | ICD-10-CM | POA: Diagnosis not present

## 2017-02-01 DIAGNOSIS — F411 Generalized anxiety disorder: Secondary | ICD-10-CM | POA: Diagnosis not present

## 2017-02-05 DIAGNOSIS — F411 Generalized anxiety disorder: Secondary | ICD-10-CM | POA: Diagnosis not present

## 2017-02-12 DIAGNOSIS — F411 Generalized anxiety disorder: Secondary | ICD-10-CM | POA: Diagnosis not present

## 2017-02-19 DIAGNOSIS — F411 Generalized anxiety disorder: Secondary | ICD-10-CM | POA: Diagnosis not present

## 2017-02-27 DIAGNOSIS — M542 Cervicalgia: Secondary | ICD-10-CM | POA: Diagnosis not present

## 2017-02-28 DIAGNOSIS — F411 Generalized anxiety disorder: Secondary | ICD-10-CM | POA: Diagnosis not present

## 2017-03-05 DIAGNOSIS — Z1231 Encounter for screening mammogram for malignant neoplasm of breast: Secondary | ICD-10-CM | POA: Diagnosis not present

## 2017-03-06 DIAGNOSIS — M542 Cervicalgia: Secondary | ICD-10-CM | POA: Diagnosis not present

## 2017-03-07 DIAGNOSIS — F411 Generalized anxiety disorder: Secondary | ICD-10-CM | POA: Diagnosis not present

## 2017-03-20 DIAGNOSIS — R195 Other fecal abnormalities: Secondary | ICD-10-CM | POA: Diagnosis not present

## 2017-03-20 DIAGNOSIS — G479 Sleep disorder, unspecified: Secondary | ICD-10-CM | POA: Diagnosis not present

## 2017-03-20 DIAGNOSIS — M7981 Nontraumatic hematoma of soft tissue: Secondary | ICD-10-CM | POA: Diagnosis not present

## 2017-03-20 DIAGNOSIS — F411 Generalized anxiety disorder: Secondary | ICD-10-CM | POA: Diagnosis not present

## 2017-03-26 DIAGNOSIS — Z01419 Encounter for gynecological examination (general) (routine) without abnormal findings: Secondary | ICD-10-CM | POA: Diagnosis not present

## 2017-03-26 DIAGNOSIS — Z6827 Body mass index (BMI) 27.0-27.9, adult: Secondary | ICD-10-CM | POA: Diagnosis not present

## 2017-03-27 DIAGNOSIS — M542 Cervicalgia: Secondary | ICD-10-CM | POA: Diagnosis not present

## 2017-03-28 DIAGNOSIS — F411 Generalized anxiety disorder: Secondary | ICD-10-CM | POA: Diagnosis not present

## 2017-04-01 DIAGNOSIS — R51 Headache: Secondary | ICD-10-CM | POA: Diagnosis not present

## 2017-04-01 DIAGNOSIS — M9901 Segmental and somatic dysfunction of cervical region: Secondary | ICD-10-CM | POA: Diagnosis not present

## 2017-04-01 DIAGNOSIS — M791 Myalgia, unspecified site: Secondary | ICD-10-CM | POA: Diagnosis not present

## 2017-04-01 DIAGNOSIS — M9902 Segmental and somatic dysfunction of thoracic region: Secondary | ICD-10-CM | POA: Diagnosis not present

## 2017-04-04 DIAGNOSIS — F411 Generalized anxiety disorder: Secondary | ICD-10-CM | POA: Diagnosis not present

## 2017-04-09 DIAGNOSIS — M9901 Segmental and somatic dysfunction of cervical region: Secondary | ICD-10-CM | POA: Diagnosis not present

## 2017-04-09 DIAGNOSIS — M791 Myalgia, unspecified site: Secondary | ICD-10-CM | POA: Diagnosis not present

## 2017-04-09 DIAGNOSIS — R51 Headache: Secondary | ICD-10-CM | POA: Diagnosis not present

## 2017-04-09 DIAGNOSIS — M9902 Segmental and somatic dysfunction of thoracic region: Secondary | ICD-10-CM | POA: Diagnosis not present

## 2017-04-11 DIAGNOSIS — F411 Generalized anxiety disorder: Secondary | ICD-10-CM | POA: Diagnosis not present

## 2017-04-18 DIAGNOSIS — F411 Generalized anxiety disorder: Secondary | ICD-10-CM | POA: Diagnosis not present

## 2017-05-02 DIAGNOSIS — F411 Generalized anxiety disorder: Secondary | ICD-10-CM | POA: Diagnosis not present

## 2017-05-07 DIAGNOSIS — M9901 Segmental and somatic dysfunction of cervical region: Secondary | ICD-10-CM | POA: Diagnosis not present

## 2017-05-07 DIAGNOSIS — R51 Headache: Secondary | ICD-10-CM | POA: Diagnosis not present

## 2017-05-07 DIAGNOSIS — M9902 Segmental and somatic dysfunction of thoracic region: Secondary | ICD-10-CM | POA: Diagnosis not present

## 2017-05-07 DIAGNOSIS — M791 Myalgia, unspecified site: Secondary | ICD-10-CM | POA: Diagnosis not present

## 2017-05-09 DIAGNOSIS — R51 Headache: Secondary | ICD-10-CM | POA: Diagnosis not present

## 2017-05-09 DIAGNOSIS — M9901 Segmental and somatic dysfunction of cervical region: Secondary | ICD-10-CM | POA: Diagnosis not present

## 2017-05-09 DIAGNOSIS — M791 Myalgia, unspecified site: Secondary | ICD-10-CM | POA: Diagnosis not present

## 2017-05-09 DIAGNOSIS — M9902 Segmental and somatic dysfunction of thoracic region: Secondary | ICD-10-CM | POA: Diagnosis not present

## 2017-05-13 DIAGNOSIS — M9901 Segmental and somatic dysfunction of cervical region: Secondary | ICD-10-CM | POA: Diagnosis not present

## 2017-05-13 DIAGNOSIS — M791 Myalgia, unspecified site: Secondary | ICD-10-CM | POA: Diagnosis not present

## 2017-05-13 DIAGNOSIS — M9902 Segmental and somatic dysfunction of thoracic region: Secondary | ICD-10-CM | POA: Diagnosis not present

## 2017-05-13 DIAGNOSIS — R51 Headache: Secondary | ICD-10-CM | POA: Diagnosis not present

## 2017-05-17 DIAGNOSIS — R51 Headache: Secondary | ICD-10-CM | POA: Diagnosis not present

## 2017-05-17 DIAGNOSIS — M791 Myalgia, unspecified site: Secondary | ICD-10-CM | POA: Diagnosis not present

## 2017-05-17 DIAGNOSIS — M9902 Segmental and somatic dysfunction of thoracic region: Secondary | ICD-10-CM | POA: Diagnosis not present

## 2017-05-17 DIAGNOSIS — M9901 Segmental and somatic dysfunction of cervical region: Secondary | ICD-10-CM | POA: Diagnosis not present

## 2017-05-20 DIAGNOSIS — M791 Myalgia, unspecified site: Secondary | ICD-10-CM | POA: Diagnosis not present

## 2017-05-20 DIAGNOSIS — R51 Headache: Secondary | ICD-10-CM | POA: Diagnosis not present

## 2017-05-20 DIAGNOSIS — M9902 Segmental and somatic dysfunction of thoracic region: Secondary | ICD-10-CM | POA: Diagnosis not present

## 2017-05-20 DIAGNOSIS — M9901 Segmental and somatic dysfunction of cervical region: Secondary | ICD-10-CM | POA: Diagnosis not present

## 2017-05-22 DIAGNOSIS — M542 Cervicalgia: Secondary | ICD-10-CM | POA: Diagnosis not present

## 2017-05-31 DIAGNOSIS — F411 Generalized anxiety disorder: Secondary | ICD-10-CM | POA: Diagnosis not present

## 2017-05-31 DIAGNOSIS — M542 Cervicalgia: Secondary | ICD-10-CM | POA: Diagnosis not present

## 2017-06-03 DIAGNOSIS — R5383 Other fatigue: Secondary | ICD-10-CM | POA: Diagnosis not present

## 2017-06-03 DIAGNOSIS — F329 Major depressive disorder, single episode, unspecified: Secondary | ICD-10-CM | POA: Diagnosis not present

## 2017-06-03 DIAGNOSIS — N926 Irregular menstruation, unspecified: Secondary | ICD-10-CM | POA: Diagnosis not present

## 2017-06-03 DIAGNOSIS — M542 Cervicalgia: Secondary | ICD-10-CM | POA: Diagnosis not present

## 2017-06-03 DIAGNOSIS — R5381 Other malaise: Secondary | ICD-10-CM | POA: Diagnosis not present

## 2017-06-06 DIAGNOSIS — F411 Generalized anxiety disorder: Secondary | ICD-10-CM | POA: Diagnosis not present

## 2017-06-11 DIAGNOSIS — F411 Generalized anxiety disorder: Secondary | ICD-10-CM | POA: Diagnosis not present

## 2017-06-12 DIAGNOSIS — D2272 Melanocytic nevi of left lower limb, including hip: Secondary | ICD-10-CM | POA: Diagnosis not present

## 2017-06-12 DIAGNOSIS — D225 Melanocytic nevi of trunk: Secondary | ICD-10-CM | POA: Diagnosis not present

## 2017-06-12 DIAGNOSIS — M542 Cervicalgia: Secondary | ICD-10-CM | POA: Diagnosis not present

## 2017-06-12 DIAGNOSIS — D2371 Other benign neoplasm of skin of right lower limb, including hip: Secondary | ICD-10-CM | POA: Diagnosis not present

## 2017-06-12 DIAGNOSIS — D2271 Melanocytic nevi of right lower limb, including hip: Secondary | ICD-10-CM | POA: Diagnosis not present

## 2017-06-15 ENCOUNTER — Emergency Department (HOSPITAL_COMMUNITY)
Admission: EM | Admit: 2017-06-15 | Discharge: 2017-06-15 | Disposition: A | Discharge status: Home / Self Care | Payer: BLUE CROSS/BLUE SHIELD | Attending: Emergency Medicine | Admitting: Emergency Medicine

## 2017-06-15 ENCOUNTER — Encounter (HOSPITAL_COMMUNITY): Payer: Self-pay | Admitting: Emergency Medicine

## 2017-06-15 DIAGNOSIS — R110 Nausea: Secondary | ICD-10-CM | POA: Diagnosis not present

## 2017-06-15 DIAGNOSIS — Z79899 Other long term (current) drug therapy: Secondary | ICD-10-CM | POA: Insufficient documentation

## 2017-06-15 DIAGNOSIS — R112 Nausea with vomiting, unspecified: Secondary | ICD-10-CM | POA: Diagnosis not present

## 2017-06-15 DIAGNOSIS — R42 Dizziness and giddiness: Secondary | ICD-10-CM

## 2017-06-15 HISTORY — DX: Dizziness and giddiness: R42

## 2017-06-15 LAB — CBC
HCT: 35.9 % — ABNORMAL LOW (ref 36.0–46.0)
Hemoglobin: 12.1 g/dL (ref 12.0–15.0)
MCH: 30.3 pg (ref 26.0–34.0)
MCHC: 33.7 g/dL (ref 30.0–36.0)
MCV: 89.8 fL (ref 78.0–100.0)
Platelets: 250 10*3/uL (ref 150–400)
RBC: 4 MIL/uL (ref 3.87–5.11)
RDW: 12 % (ref 11.5–15.5)
WBC: 9.2 10*3/uL (ref 4.0–10.5)

## 2017-06-15 LAB — URINALYSIS, ROUTINE W REFLEX MICROSCOPIC
Bilirubin Urine: NEGATIVE
Glucose, UA: NEGATIVE mg/dL
Hgb urine dipstick: NEGATIVE
Ketones, ur: 20 mg/dL — AB
Leukocytes, UA: NEGATIVE
Nitrite: NEGATIVE
Protein, ur: NEGATIVE mg/dL
Specific Gravity, Urine: 1.021 (ref 1.005–1.030)
pH: 9 — ABNORMAL HIGH (ref 5.0–8.0)

## 2017-06-15 LAB — I-STAT BETA HCG BLOOD, ED (MC, WL, AP ONLY): I-stat hCG, quantitative: <5 m[IU]/mL (ref <5)

## 2017-06-15 LAB — BASIC METABOLIC PANEL
Anion gap: 11 (ref 5–15)
BUN: 16 mg/dL (ref 6–20)
CO2: 23 mmol/L (ref 22–32)
Calcium: 9.3 mg/dL (ref 8.9–10.3)
Chloride: 105 mmol/L (ref 101–111)
Creatinine, Ser: 0.92 mg/dL (ref 0.44–1.00)
GFR calc Af Amer: >60 mL/min (ref >60)
GFR calc non Af Amer: >60 mL/min (ref >60)
Glucose, Bld: 128 mg/dL — ABNORMAL HIGH (ref 65–99)
Potassium: 3.6 mmol/L (ref 3.5–5.1)
Sodium: 139 mmol/L (ref 135–145)

## 2017-06-15 MED ORDER — MECLIZINE HCL 25 MG PO TABS: 25.0000 mg | ORAL_TABLET | Freq: Three times a day (TID) | ORAL | 0 refills | Status: DC | PRN

## 2017-06-15 MED ORDER — PROMETHAZINE HCL 25 MG/ML IJ SOLN
12.5000 mg | Freq: Once | INTRAMUSCULAR | Status: AC
  Administered 2017-06-15: 12.5 mg via INTRAVENOUS
  Filled 2017-06-15: qty 1

## 2017-06-15 MED ORDER — LORAZEPAM 0.5 MG PO TABS
0.5000 mg | ORAL_TABLET | Freq: Once | ORAL | Status: AC
  Administered 2017-06-15: 0.5 mg via ORAL
  Filled 2017-06-15: qty 1

## 2017-06-15 MED ORDER — MECLIZINE HCL 25 MG PO TABS
25.0000 mg | ORAL_TABLET | Freq: Once | ORAL | Status: AC
  Administered 2017-06-15: 25 mg via ORAL
  Filled 2017-06-15: qty 1

## 2017-06-15 MED ORDER — SODIUM CHLORIDE 0.9 % IV BOLUS
1000.0000 mL | Freq: Once | INTRAVENOUS | Status: AC
  Administered 2017-06-15: 1000 mL via INTRAVENOUS

## 2017-06-15 MED ORDER — ONDANSETRON 4 MG PO TBDP
4.0000 mg | ORAL_TABLET | Freq: Once | ORAL | Status: AC | PRN
  Administered 2017-06-15: 4 mg via ORAL
  Filled 2017-06-15: qty 1

## 2017-06-15 NOTE — ED Notes (Signed)
Pt ambulated to bathroom, Pt still felt dizzy

## 2017-06-15 NOTE — Discharge Instructions (Addendum)
Please make sure that you are eating breakfast in the mornings, especially before going to yoga.  Please make sure that you are staying well-hydrated.  I have given you a prescription for meclizine also known as Antivert.  This is available over-the-counter as Dramamine-N.  Please schedule an appointment with your primary care provider for a repeat evaluation.  Today you received medications that may make you sleepy or impair your ability to make decisions.  For the next 24 hours please do not drive, operate heavy machinery, care for a small child with out another adult present, or perform any activities that may cause harm to you or someone else if you were to fall asleep or be impaired.   You are being prescribed a medication which may make you sleepy. Please follow up of listed precautions for at least 24 hours after taking one dose.

## 2017-06-15 NOTE — ED Provider Notes (Signed)
[facility] EMERGENCY DEPARTMENT Provider Note   CSN: [id number] Arrival date & time: 06/15/17  1105     History   Chief Complaint Chief Complaint  Patient presents with  . Dizziness    HPI Sabrina Nichols is a 47 y.o. female with a past medical history of dizziness about 3 weeks ago, who presents today for evaluation of dizziness.  She reports that she was at yoga when she suddenly got dizzy, feeling like things were moving when they were not and feeling like she was going to pass out.  This is associated with nausea and vomiting.  She reports that she had a very slight, much less severe episode of dizziness approximately 3 weeks ago however that was brief and resolved rapidly.  She reports that she did not eat breakfast this morning, only had half a cup of coffee and then went to yoga.  Denies any headache, blurry vision/double vision.  She denies chest pain, shortness of breath, or abdominal pain.  No neck pain.  In route CBG normal per EMS.  HPI  Past Medical History:  Diagnosis Date  . Varicose veins   . Vertigo     Patient Active Problem List   Diagnosis Date Noted  . 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 Chronic venous insufficiency 08/01/2012  . PES PLANUS 09/16/2008  . GAIT DISTURBANCE 09/16/2008    Past Surgical History:  Procedure Laterality Date  . CESAREAN SECTION  Nov. 2009  . laproscopic surgery to remove IUD  March 2010     OB History    Gravida  2   Para  2   Term  2   Preterm      AB      Living  2     SAB      TAB      Ectopic      Multiple      Live Births  2            Home Medications    Prior to Admission medications   Medication Sig Start Date End Date Taking? Authorizing Provider  Cholecalciferol (VITAMIN D-3) 5000 UNITS TABS Take by mouth daily.    [provider]  meclizine (ANTIVERT) 25 MG tablet Take 1 tablet (25 mg total) by mouth 3  (three) times daily as needed for dizziness. 06/15/17   Cristina Gong, PA-C  Multiple Vitamin (MULTIVITAMIN) tablet Take 1 tablet by mouth daily.    [provider]  Pregnenolone POWD 50 mg by Does not apply route. 50 mg Tab- once daily    [provider]  Probiotic Product (PROBIOTIC DAILY PO) Take 25 mg by mouth daily.    [provider]  terbinafine (LAMISIL) 250 MG tablet Please take one a day x 7days, repeat every 4 weeks x 4 months 08/24/15   Lenn Sink, DPM    Family History Family History  Problem Relation Age of Onset  . Deep vein thrombosis Mother     Social History Social History   Tobacco Use  . Smoking status: Never Smoker  . Smokeless tobacco: Never Used  Substance Use Topics  . Alcohol use: No  . Drug use: No     Allergies   Percocet [oxycodone-acetaminophen]   Review of Systems Review of Systems  Constitutional: Negative for chills and fever.  HENT: Negative for ear pain and sore throat.   Eyes: Negative for pain and visual disturbance.  Respiratory: Negative for cough, chest tightness and shortness of breath.   Cardiovascular: Negative for chest pain and palpitations.  Gastrointestinal: Positive for nausea and vomiting. Negative for abdominal pain and diarrhea.  Genitourinary: Negative for dysuria, hematuria and urgency.  Musculoskeletal: Negative for arthralgias and back pain.  Skin: Negative for color change and rash.  Neurological: Positive for dizziness and light-headedness. Negative for seizures and syncope.  All other systems reviewed and are negative.    Physical Exam Updated Vital Signs BP 103/66 (BP Location: Right Arm)   Pulse 76   Temp 98.2 F (36.8 C)   Resp 18   SpO2 100%   Physical Exam  Constitutional: She is oriented to person, place, and time. She appears well-developed and well-nourished. No distress.  HENT:  Head: Normocephalic and atraumatic.  Mouth/Throat: Oropharynx is clear and moist.    Eyes: Conjunctivae are normal.  Neck: Neck supple.  Cardiovascular: Normal rate, regular rhythm, normal heart sounds and intact distal pulses. Exam reveals no gallop and no friction rub.  No murmur heard. Pulmonary/Chest: Effort normal and breath sounds normal. No respiratory distress.  Abdominal: Soft. There is no tenderness.  Musculoskeletal: She exhibits no edema.  Neurological: She is alert and oriented to person, place, and time. No cranial nerve deficit. Coordination (Normal finger to nose. ) normal.  Patient has no nystagmus.  Has increased dizziness when going from sitting to laying down with head turned to either side, not when head kept straight.    Skin: Skin is warm and dry.  Psychiatric: She has a normal mood and affect.  Nursing note and vitals reviewed.    ED Treatments / Results  Labs (all labs ordered are listed, but only abnormal results are displayed) Labs Reviewed  BASIC METABOLIC PANEL - Abnormal; Notable for the following components:      Result Value   Glucose, Bld 128 (*)    All other components within normal limits  CBC - Abnormal; Notable for the following components:   HCT 35.9 (*)    All other components within normal limits  URINALYSIS, ROUTINE W REFLEX MICROSCOPIC - Abnormal; Notable for the following components:   pH 9.0 (*)    Ketones, ur 20 (*)    Bacteria, UA RARE (*)    All other components within normal limits  I-STAT BETA HCG BLOOD, ED ([facility], [facility], [facility] ONLY)    EKG EKG Interpretation  Date/Time:  Saturday June 15 2017 11:26:45 EDT Ventricular Rate:  72 PR Interval:  136 QRS Duration: 82 QT Interval:  406 QTC Calculation: 444 R Axis:   77 Text Interpretation:  Normal sinus rhythm Normal ECG Confirmed by Jacalyn Lefevre [id number]) on 06/15/2017 2:56:06 PM   Radiology No results found.  Procedures Procedures (including critical care time)  Medications Ordered in ED Medications  ondansetron (ZOFRAN-ODT) disintegrating tablet 4 mg (4 mg  Oral Given 06/15/17 1123)  sodium chloride 0.9 % bolus 1,000 mL (0 mLs Intravenous Stopped 06/15/17 1401)  meclizine (ANTIVERT) tablet 25 mg (25 mg Oral Given 06/15/17 1238)  promethazine (PHENERGAN) injection 12.5 mg (12.5 mg Intravenous Given 06/15/17 1238)  LORazepam (ATIVAN) tablet 0.5 mg (0.5 mg Oral Given 06/15/17 1258)     Initial Impression / Assessment and Plan / ED Course  I have reviewed the triage vital signs and the nursing notes.  Pertinent labs & imaging results that were available during my care of the patient were reviewed by me and considered in my medical decision making (see chart for details).  Clinical Course as of Jun 15 1524  Sat Jun 15, 2017  1252 RN informed me that patient was requested something for anxiety.  Chart review does not show any medications, however PMP shows that she gets prescriptions for Ativan 1 mg tablet.  This is not regular as last prescription was in January.  Will give p.o. Ativan.   [EH]  1503 Spoke with [facility] who will admit.    [EH]    Clinical Course User Index [EH] Cristina GongHammond, Isaias Dowson W, PA-C   Patient presents today for evaluation of dizziness.  The dizziness started while she was at yoga, she did not eat breakfast and only had half a cup of coffee before yoga.  Labs were obtained and reviewed, patient is not anemic, no significant electrolyte abnormalities.  Urine did show 20 ketones, consistent with her not eating.  Patient was treated in the emergency room with IV fluids, meclizine, and Phenergan.  She was given Ativan for anxiety as she takes this at home.  After these medications she reported full resolution of her symptoms.  She was able to walk without difficulty.  She had no focal neuro deficits, normal finger-to-nose.  She has not had any trauma, CT had not indicated.  Patient discharged home with prescriptions for meclizine, outpatient follow-up.  Strict return precautions were discussed and patient states her understanding.  Final Clinical  Impressions(s) / ED Diagnoses   Final diagnoses:  Dizziness    ED Discharge Orders        Ordered    meclizine (ANTIVERT) 25 MG tablet  3 times daily PRN     06/15/17 1509       Cristina GongHammond, Kalsey Lull W, PA-C 06/15/17 1526    Jacalyn LefevreHaviland, Julie, MD 06/16/17 [phone]

## 2017-06-15 NOTE — ED Triage Notes (Signed)
Per [facility]: Patient to ED from yoga for sudden onset dizziness, worse with movement and position changes, and N/V. Patient reports hx vertigo, no other medical history. EMS VS: 128/82, HR 72 NSR, 99% RA, CBG 120. Patient A&O x 4, neuro intact. Denies fevers/chills. No SOB/CP.

## 2017-06-17 DIAGNOSIS — M542 Cervicalgia: Secondary | ICD-10-CM | POA: Diagnosis not present

## 2017-07-02 ENCOUNTER — Other Ambulatory Visit: Payer: Self-pay | Admitting: Gastroenterology

## 2017-07-02 DIAGNOSIS — R1033 Periumbilical pain: Secondary | ICD-10-CM | POA: Diagnosis not present

## 2017-07-02 DIAGNOSIS — R1011 Right upper quadrant pain: Secondary | ICD-10-CM

## 2017-07-02 DIAGNOSIS — R112 Nausea with vomiting, unspecified: Secondary | ICD-10-CM | POA: Diagnosis not present

## 2017-07-02 DIAGNOSIS — R1013 Epigastric pain: Secondary | ICD-10-CM | POA: Diagnosis not present

## 2017-07-03 DIAGNOSIS — H818X9 Other disorders of vestibular function, unspecified ear: Secondary | ICD-10-CM | POA: Diagnosis not present

## 2017-07-04 ENCOUNTER — Ambulatory Visit (HOSPITAL_COMMUNITY): Payer: BLUE CROSS/BLUE SHIELD

## 2017-07-04 ENCOUNTER — Encounter (HOSPITAL_COMMUNITY): Payer: BLUE CROSS/BLUE SHIELD

## 2017-07-10 DIAGNOSIS — R5383 Other fatigue: Secondary | ICD-10-CM | POA: Diagnosis not present

## 2017-07-10 DIAGNOSIS — N926 Irregular menstruation, unspecified: Secondary | ICD-10-CM | POA: Diagnosis not present

## 2017-07-10 DIAGNOSIS — R5381 Other malaise: Secondary | ICD-10-CM | POA: Diagnosis not present

## 2017-07-10 DIAGNOSIS — R7303 Prediabetes: Secondary | ICD-10-CM | POA: Diagnosis not present

## 2017-07-11 DIAGNOSIS — F411 Generalized anxiety disorder: Secondary | ICD-10-CM | POA: Diagnosis not present

## 2017-07-12 ENCOUNTER — Encounter (HOSPITAL_COMMUNITY): Payer: BLUE CROSS/BLUE SHIELD

## 2017-07-12 ENCOUNTER — Ambulatory Visit (HOSPITAL_COMMUNITY)
Admission: RE | Admit: 2017-07-12 | Discharge: 2017-07-12 | Disposition: A | Discharge status: Home / Self Care | Payer: BLUE CROSS/BLUE SHIELD | Source: Ambulatory Visit | Attending: Gastroenterology | Admitting: Gastroenterology

## 2017-07-12 DIAGNOSIS — R1011 Right upper quadrant pain: Secondary | ICD-10-CM | POA: Diagnosis not present

## 2017-07-12 DIAGNOSIS — N1339 Other hydronephrosis: Secondary | ICD-10-CM | POA: Diagnosis not present

## 2017-07-12 DIAGNOSIS — N1330 Unspecified hydronephrosis: Secondary | ICD-10-CM | POA: Insufficient documentation

## 2017-07-22 ENCOUNTER — Other Ambulatory Visit: Payer: Self-pay | Admitting: Family Medicine

## 2017-07-22 DIAGNOSIS — N1330 Unspecified hydronephrosis: Secondary | ICD-10-CM

## 2017-08-23 ENCOUNTER — Ambulatory Visit: Payer: BLUE CROSS/BLUE SHIELD | Admitting: Podiatry

## 2017-08-23 ENCOUNTER — Encounter: Payer: Self-pay | Admitting: Podiatry

## 2017-08-23 ENCOUNTER — Other Ambulatory Visit: Payer: Self-pay | Admitting: Podiatry

## 2017-08-23 ENCOUNTER — Ambulatory Visit (INDEPENDENT_AMBULATORY_CARE_PROVIDER_SITE_OTHER): Payer: BLUE CROSS/BLUE SHIELD

## 2017-08-23 DIAGNOSIS — M722 Plantar fascial fibromatosis: Secondary | ICD-10-CM

## 2017-08-23 DIAGNOSIS — M779 Enthesopathy, unspecified: Secondary | ICD-10-CM

## 2017-08-23 DIAGNOSIS — M7661 Achilles tendinitis, right leg: Secondary | ICD-10-CM

## 2017-08-23 DIAGNOSIS — M7751 Other enthesopathy of right foot: Secondary | ICD-10-CM

## 2017-08-23 NOTE — Progress Notes (Signed)
   Subjective:    Patient ID: Sabrina Nichols, female    DOB: 12/09/1970, 47 y.o.   MRN: [id number]  HPI    Review of Systems  All other systems reviewed and are negative.      Objective:   Physical Exam        Assessment & Plan:

## 2017-08-23 NOTE — Patient Instructions (Signed)

## 2017-08-24 NOTE — Progress Notes (Signed)
Subjective:   Patient ID: Sabrina Nichols, female   DOB: 47 y.o.   MRN: [id number]   HPI Patient presents with pain on the outside of the right foot that is been sore and wants to make sure there is nothing wrong with the Achilles tendon   ROS      Objective:  Physical Exam  Neurovascular status intact with discomfort on the posterior lateral aspect of the Achilles of a mild nature with no current equinus condition noted     Assessment:  Mild Achilles tendinitis right lateral side     Plan:  Reviewed condition and recommended stretching exercises anti-inflammatories physical therapy and shoes with heels.  Patient will be seen back to  X-ray indicates no indications of spurring or fracture or arthritic issue

## 2017-09-20 DIAGNOSIS — F411 Generalized anxiety disorder: Secondary | ICD-10-CM | POA: Diagnosis not present

## 2017-09-26 DIAGNOSIS — D2371 Other benign neoplasm of skin of right lower limb, including hip: Secondary | ICD-10-CM | POA: Diagnosis not present

## 2017-10-04 DIAGNOSIS — R5381 Other malaise: Secondary | ICD-10-CM | POA: Diagnosis not present

## 2017-10-04 DIAGNOSIS — R1013 Epigastric pain: Secondary | ICD-10-CM | POA: Diagnosis not present

## 2017-10-04 DIAGNOSIS — N926 Irregular menstruation, unspecified: Secondary | ICD-10-CM | POA: Diagnosis not present

## 2017-10-04 DIAGNOSIS — R7303 Prediabetes: Secondary | ICD-10-CM | POA: Diagnosis not present

## 2017-10-04 DIAGNOSIS — E039 Hypothyroidism, unspecified: Secondary | ICD-10-CM | POA: Diagnosis not present

## 2017-10-05 DIAGNOSIS — F411 Generalized anxiety disorder: Secondary | ICD-10-CM | POA: Diagnosis not present

## 2017-10-11 ENCOUNTER — Ambulatory Visit (INDEPENDENT_AMBULATORY_CARE_PROVIDER_SITE_OTHER): Payer: BLUE CROSS/BLUE SHIELD | Admitting: Orthopaedic Surgery

## 2017-10-14 DIAGNOSIS — R6882 Decreased libido: Secondary | ICD-10-CM | POA: Diagnosis not present

## 2017-10-14 DIAGNOSIS — R5383 Other fatigue: Secondary | ICD-10-CM | POA: Diagnosis not present

## 2017-10-14 DIAGNOSIS — R5381 Other malaise: Secondary | ICD-10-CM | POA: Diagnosis not present

## 2017-10-14 DIAGNOSIS — N926 Irregular menstruation, unspecified: Secondary | ICD-10-CM | POA: Diagnosis not present

## 2017-10-15 ENCOUNTER — Encounter: Payer: Self-pay | Admitting: Sports Medicine

## 2017-10-15 ENCOUNTER — Ambulatory Visit (INDEPENDENT_AMBULATORY_CARE_PROVIDER_SITE_OTHER): Payer: BLUE CROSS/BLUE SHIELD | Admitting: Orthopaedic Surgery

## 2017-10-15 ENCOUNTER — Ambulatory Visit (INDEPENDENT_AMBULATORY_CARE_PROVIDER_SITE_OTHER): Payer: BLUE CROSS/BLUE SHIELD | Admitting: Sports Medicine

## 2017-10-15 VITALS — BP 102/78 | Ht 65.0 in | Wt 160.0 lb

## 2017-10-15 DIAGNOSIS — M25561 Pain in right knee: Secondary | ICD-10-CM | POA: Diagnosis not present

## 2017-10-15 DIAGNOSIS — M25562 Pain in left knee: Secondary | ICD-10-CM | POA: Diagnosis not present

## 2017-10-15 DIAGNOSIS — N959 Unspecified menopausal and perimenopausal disorder: Secondary | ICD-10-CM | POA: Diagnosis not present

## 2017-10-15 NOTE — Progress Notes (Signed)
   HPI  CC: Bilateral knee pain  Patient presents for bilateral knee pain of 6 weeks duration.  She reports that about 6 weeks ago she started a new exercise regimen at [facility] fitness.  Previously she had done low impact activities with power yoga.  Add OTF after she started running and lifting.  After a few days on this exercise regimen she experienced bilateral knee pain.  She de escalated her exercise to power walking on an incline rather than running.  However the pain persisted and has limited her ability to exercise since then.  She has used ice, elevation.  She stopped doing [employer] fitness altogether 1 week ago and has seen improvement in her symptoms.  She has not seen any swelling in her knees.  She has no prior episodes of similar pain.  No fevers.  No other joint pain or swelling.  No previous surgeries on her knees.  No previous knee injuries.  ROS: Per HPI; in addition no fever, no rash, no additional weakness, no additional numbness, no additional paresthesias, and no additional falls/injury.   Objective: BP 102/78   Ht 5\' 5"  (1.651 m)   Wt 160 lb (72.6 kg)   BMI 26.63 kg/m  Gen:NAD, well groomed, a/o x3, normal affect.  CV: Well-perfused. Warm.  Resp: Non-labored.  Neuro: Sensation intact throughout. No gross coordination deficits.  Gait: Nonpathologic posture, unremarkable stride without signs of limp or balance issues.  Hip, bilateral: No tenderness to palpation.  ROM full in all directions (IR: 80/ ER: 80/Flex: 120/Ext: 100/Abd: 45/Add: 45); Strength 4/5 in abduction/ OK in bilateral hip flexors. Greater trochanter without tenderness to palpation.   LEFT KNEE: +mild ttp over medial joint line. No erythema, effusion, muscle wasting or obvious bony abnormalities. No warmth or condyle tenderness. ROM full in flexion and extension. No varus or valgus deformity. Ligaments with solid consistent endpoints including ACL, PCL, LCL, MCL. Negative thessaly.  Patellar and quadriceps tendons unremarkable.   RIGHT KNEE  + mild ttp over lateral joint line. No erythema, effusion, muscle wasting or obvious bony abnormalities. No warmth or condyle tenderness. ROM full in flexion and extension. No varus or valgus deformity. Ligaments with solid consistent endpoints including ACL, PCL, LCL, MCL. Negative thessaly. Patellar and quadriceps tendons unremarkable.    POCUS No swelling in SPP bilaterally Good cartilage space in troch. Groove Tendons and meniscus normal bilat.  Impression: unremarkable [place] Of bilateral knees  Ultrasound and interpretation by Sibyl Parr. Doralyn Kirkes, MD   Assessment and Plan:  Bilateral knee pain -history of sudden onset of bilateral knee pain at the same time that she started high-impact activity taken with her exam seems most consistent with referred pain from her hip.  Her hip abductors bilaterally noted to be weak on exam compared with strong quadricep muscles.  To have the patient work on strengthening her hip muscles to help with this referred pain.  Ultrasound was completed at bedside and did not show any evidence of arthritis in bilateral knees. - Patient was given strengthening exercises including straight leg raises -Once she is able to do strengthening exercises with body weight, would add a 2 pound weights - Continue yoga - Follow-up in 6 weeks   Howard Pouch, MD PGY-3 [facility]  I observed and examined the patient with the resident and agree with assessment and plan.  Note reviewed and modified by me. Sterling Big, MD

## 2017-10-17 ENCOUNTER — Ambulatory Visit (INDEPENDENT_AMBULATORY_CARE_PROVIDER_SITE_OTHER): Payer: BLUE CROSS/BLUE SHIELD | Admitting: Orthopaedic Surgery

## 2017-10-17 DIAGNOSIS — F411 Generalized anxiety disorder: Secondary | ICD-10-CM | POA: Diagnosis not present

## 2017-11-13 DIAGNOSIS — F411 Generalized anxiety disorder: Secondary | ICD-10-CM | POA: Diagnosis not present

## 2017-11-21 ENCOUNTER — Encounter: Payer: Self-pay | Admitting: Sports Medicine

## 2017-11-21 ENCOUNTER — Ambulatory Visit (INDEPENDENT_AMBULATORY_CARE_PROVIDER_SITE_OTHER): Payer: BLUE CROSS/BLUE SHIELD | Admitting: Sports Medicine

## 2017-11-21 VITALS — BP 108/72 | Ht 65.0 in | Wt 155.0 lb

## 2017-11-21 DIAGNOSIS — R269 Unspecified abnormalities of gait and mobility: Secondary | ICD-10-CM | POA: Diagnosis not present

## 2017-11-21 DIAGNOSIS — M2142 Flat foot [pes planus] (acquired), left foot: Secondary | ICD-10-CM

## 2017-11-21 DIAGNOSIS — G8929 Other chronic pain: Secondary | ICD-10-CM | POA: Insufficient documentation

## 2017-11-21 DIAGNOSIS — M2141 Flat foot [pes planus] (acquired), right foot: Secondary | ICD-10-CM

## 2017-11-21 DIAGNOSIS — M25561 Pain in right knee: Secondary | ICD-10-CM

## 2017-11-21 DIAGNOSIS — M25562 Pain in left knee: Secondary | ICD-10-CM

## 2017-11-21 NOTE — Assessment & Plan Note (Signed)
Initially from bilateral hip abductor weakness. Improved significantly with home exercises. Now with medial knee pain as a result of her pes planus causing over pronation and valgus stress. Orthotics made today with improvement with gait

## 2017-11-21 NOTE — Assessment & Plan Note (Signed)
Custom orthotics made for patient today with improvement of her stride to a more neutral alignment in order to ameliorate the valgus stress on her knees.

## 2017-11-21 NOTE — Progress Notes (Signed)
   HPI  CC: follow up bilateral knee pain  Patient states that she has been doing her home exercises daily. Has also been doing power yoga and staying away from [facility]. She has noticed that her bilateral knee pain improved and she was initially pain free. Given her improvement she re-trialed a HIIT workout and and subsequently developed some mild bilateral knee pain again. However instead of being on the lateral aspects, was more on the medial aspects of both knees. No injury or popping or cracking. No weakness. No swelling. Would like to start running and would like orthotics.  Medications/Interventions Tried: home exercises  See HPI and/or previous note for associated ROS.  Objective: BP 108/72   Ht 5\' 5"  (1.651 m)   Wt 155 lb (70.3 kg)   BMI 25.79 kg/m  Gen: NAD, well groomed, a/o x3, normal affect.  CV: Well-perfused. Warm.  Resp: Non-labored.  Neuro: Sensation intact throughout. No gross coordination deficits.  Gait: Nonpathologic posture, stride notable for overpronation without signs of limp or balance issues. Improved with custom orthotics to a more neutral alignment. MSK: bilateral hip abductors and adductors with 5/5 strength. Flexion and extension also with full 5/5 strength. Bilateral pes planus with L foot 1st toe with valgus changes. R foot with 1st toe splaying.   Patient was fitted for a : standard, cushioned, semi-rigid orthotic. The orthotic was heated and afterward the patient stood on the orthotic blank positioned on the orthotic stand. The patient was positioned in subtalar neutral position and 10 degrees of ankle dorsiflexion in a weight bearing stance. After completion of molding, a stable base was applied to the orthotic blank. The blank was ground to a stable position for weight bearing. Size: 7 red EVA Base: blue medium EVA Posting: none Additional orthotic padding: none  Post evaluation showed good gait correction and we modified the left orthotic  to give less pressure over lateral foot.  Sterling Big, MD  Assessment and plan:  Chronic pain of both knees Initially from bilateral hip abductor weakness. Improved significantly with home exercises. Now with medial knee pain as a result of her pes planus causing over pronation and valgus stress. Orthotics made today with improvement with gait  Pes planus Custom orthotics made for patient today with improvement of her stride to a more neutral alignment in order to ameliorate the valgus stress on her knees.   Leland Her, DO PGY-3, [facility] 11/21/2017 9:06 AM   I observed and examined the patient with the resident and agree with assessment and plan.  Note reviewed and modified by me. Enid Baas, MD

## 2017-11-21 NOTE — Assessment & Plan Note (Signed)
See orthotic preparation  She will need to replace thse about q 3 years  I spent 35 minutes with this patient. Over 50% of visit was spend in counseling and coordination of care for problems with prevention of foot breakdown and use of orthotics.  KBF

## 2017-11-27 DIAGNOSIS — F411 Generalized anxiety disorder: Secondary | ICD-10-CM | POA: Diagnosis not present

## 2017-12-06 ENCOUNTER — Ambulatory Visit (INDEPENDENT_AMBULATORY_CARE_PROVIDER_SITE_OTHER): Payer: BLUE CROSS/BLUE SHIELD

## 2017-12-06 ENCOUNTER — Encounter (INDEPENDENT_AMBULATORY_CARE_PROVIDER_SITE_OTHER): Payer: Self-pay | Admitting: Orthopaedic Surgery

## 2017-12-06 ENCOUNTER — Ambulatory Visit (INDEPENDENT_AMBULATORY_CARE_PROVIDER_SITE_OTHER): Payer: Self-pay

## 2017-12-06 ENCOUNTER — Ambulatory Visit (INDEPENDENT_AMBULATORY_CARE_PROVIDER_SITE_OTHER): Payer: BLUE CROSS/BLUE SHIELD | Admitting: Orthopaedic Surgery

## 2017-12-06 DIAGNOSIS — M25562 Pain in left knee: Secondary | ICD-10-CM | POA: Diagnosis not present

## 2017-12-06 DIAGNOSIS — M25561 Pain in right knee: Secondary | ICD-10-CM

## 2017-12-06 NOTE — Progress Notes (Signed)
Office Visit Note   Patient: Sabrina Nichols           Date of Birth: [birth year]           MRN: [id number] Visit Date: 12/06/2017              Requested by: Jonnie Kind, MD [address] [place], [place] [address] PCP: Jonnie Kind, MD   Assessment & Plan: Visit Diagnoses:  1. Pain in both knees, unspecified chronicity     Plan: Impression is bilateral patellofemoral chondromalacia.  We discussed the issue in detail.  I recommend physical therapy for quadriceps strengthening.  We discussed activity restrictions.  Sample of [place] was provided today.  Follow-Up Instructions: Return if symptoms worsen or fail to improve.   Orders:  Orders Placed This Encounter  Procedures  . XR Knee Complete 4 Views Left  . XR Knee Complete 4 Views Right   No orders of the defined types were placed in this encounter.     Procedures: No procedures performed   Clinical Data: No additional findings.   Subjective: Chief Complaint  Patient presents with  . Right Knee - Pain  . Left Knee - Pain    Patient is a 47 year old female comes in with bilateral knee pain for several months that is worse with exercising and flexion of the knee.  It is worse with going downstairs.  She has not had any injections or braces.  She does do [employer] and this seems to exacerbate it.  Denies any swelling.  She did receive orthotics for her flatfeet recently.  She denies any numbness and tingling.  She endorses pain at the superior lateral aspect of the patella   Review of Systems  Constitutional: Negative.   HENT: Negative.   Eyes: Negative.   Respiratory: Negative.   Cardiovascular: Negative.   Endocrine: Negative.   Musculoskeletal: Negative.   Neurological: Negative.   Hematological: Negative.   Psychiatric/Behavioral: Negative.   All other systems reviewed and are negative.    Objective: Vital Signs: There were no vitals taken for this visit.  Physical Exam    Constitutional: She is oriented to person, place, and time. She appears well-developed and well-nourished.  HENT:  Head: Normocephalic and atraumatic.  Eyes: EOM are normal.  Neck: Neck supple.  Pulmonary/Chest: Effort normal.  Abdominal: Soft.  Neurological: She is alert and oriented to person, place, and time.  Skin: Skin is warm. Capillary refill takes less than 2 seconds.  Psychiatric: She has a normal mood and affect. Her behavior is normal. Judgment and thought content normal.  Nursing note and vitals reviewed.   Ortho Exam Bilateral knee exam shows no joint effusion.  Minimal patellofemoral crepitus.  Mild tenderness to palpation at the superolateral corner of the patella with range of motion.  Patella mobility is normal.  Normal knee joint range of motion.  Collaterals and cruciates are stable. Specialty Comments:  No specialty comments available.  Imaging: Xr Knee Complete 4 Views Left  Result Date: 12/06/2017 Minimal lateral patellar spurring.  Xr Knee Complete 4 Views Right  Result Date: 12/06/2017 Minimal lateral patellar spurring.    PMFS History: Patient Active Problem List   Diagnosis Date Noted  . Chronic pain of both knees 11/21/2017  . 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 Chronic venous insufficiency 08/01/2012  . Pes planus 09/16/2008  . GAIT DISTURBANCE 09/16/2008   Past Medical History:  Diagnosis Date  .  Varicose veins   . Vertigo     Family History  Problem Relation Age of Onset  . Deep vein thrombosis Mother     Past Surgical History:  Procedure Laterality Date  . CESAREAN SECTION  Nov. 2009  . laproscopic surgery to remove IUD  March 2010   Social History   Occupational History  . Not on file  Tobacco Use  . Smoking status: Never Smoker  . Smokeless tobacco: Never Used  Substance and Sexual Activity  . Alcohol use: No  . Drug use: No  . Sexual activity: Yes

## 2017-12-09 DIAGNOSIS — F411 Generalized anxiety disorder: Secondary | ICD-10-CM | POA: Diagnosis not present

## 2017-12-23 DIAGNOSIS — F411 Generalized anxiety disorder: Secondary | ICD-10-CM | POA: Diagnosis not present

## 2018-01-13 DIAGNOSIS — E039 Hypothyroidism, unspecified: Secondary | ICD-10-CM | POA: Diagnosis not present

## 2018-01-13 DIAGNOSIS — N926 Irregular menstruation, unspecified: Secondary | ICD-10-CM | POA: Diagnosis not present

## 2018-01-13 DIAGNOSIS — R6882 Decreased libido: Secondary | ICD-10-CM | POA: Diagnosis not present

## 2018-01-13 DIAGNOSIS — R5383 Other fatigue: Secondary | ICD-10-CM | POA: Diagnosis not present

## 2018-01-22 DIAGNOSIS — R5383 Other fatigue: Secondary | ICD-10-CM | POA: Diagnosis not present

## 2018-01-22 DIAGNOSIS — R5381 Other malaise: Secondary | ICD-10-CM | POA: Diagnosis not present

## 2018-01-22 DIAGNOSIS — N926 Irregular menstruation, unspecified: Secondary | ICD-10-CM | POA: Diagnosis not present

## 2018-01-22 DIAGNOSIS — R6882 Decreased libido: Secondary | ICD-10-CM | POA: Diagnosis not present

## 2018-01-28 DIAGNOSIS — Z01419 Encounter for gynecological examination (general) (routine) without abnormal findings: Secondary | ICD-10-CM | POA: Diagnosis not present

## 2018-01-28 DIAGNOSIS — Z6827 Body mass index (BMI) 27.0-27.9, adult: Secondary | ICD-10-CM | POA: Diagnosis not present

## 2018-01-28 DIAGNOSIS — Z1212 Encounter for screening for malignant neoplasm of rectum: Secondary | ICD-10-CM | POA: Diagnosis not present

## 2018-02-05 DIAGNOSIS — F411 Generalized anxiety disorder: Secondary | ICD-10-CM | POA: Diagnosis not present

## 2018-02-07 ENCOUNTER — Other Ambulatory Visit: Payer: Self-pay | Admitting: Family Medicine

## 2018-02-07 DIAGNOSIS — R7989 Other specified abnormal findings of blood chemistry: Secondary | ICD-10-CM

## 2018-02-07 DIAGNOSIS — N1330 Unspecified hydronephrosis: Secondary | ICD-10-CM

## 2018-02-10 DIAGNOSIS — K6289 Other specified diseases of anus and rectum: Secondary | ICD-10-CM | POA: Diagnosis not present

## 2018-02-11 DIAGNOSIS — F411 Generalized anxiety disorder: Secondary | ICD-10-CM | POA: Diagnosis not present

## 2018-02-19 ENCOUNTER — Ambulatory Visit
Admission: RE | Admit: 2018-02-19 | Discharge: 2018-02-19 | Disposition: A | Discharge status: Home / Self Care | Payer: BLUE CROSS/BLUE SHIELD | Source: Ambulatory Visit | Attending: Family Medicine | Admitting: Family Medicine

## 2018-02-19 DIAGNOSIS — N1330 Unspecified hydronephrosis: Secondary | ICD-10-CM

## 2018-02-19 DIAGNOSIS — R7989 Other specified abnormal findings of blood chemistry: Secondary | ICD-10-CM

## 2018-02-19 MED ORDER — IOPAMIDOL (ISOVUE-300) INJECTION 61%
100.0000 mL | Freq: Once | INTRAVENOUS | Status: AC | PRN
  Administered 2018-02-19: 100 mL via INTRAVENOUS

## 2018-03-12 DIAGNOSIS — Z1231 Encounter for screening mammogram for malignant neoplasm of breast: Secondary | ICD-10-CM | POA: Diagnosis not present

## 2018-03-12 DIAGNOSIS — F411 Generalized anxiety disorder: Secondary | ICD-10-CM | POA: Diagnosis not present

## 2018-03-22 DIAGNOSIS — F411 Generalized anxiety disorder: Secondary | ICD-10-CM | POA: Diagnosis not present

## 2018-04-15 DIAGNOSIS — F411 Generalized anxiety disorder: Secondary | ICD-10-CM | POA: Diagnosis not present

## 2018-05-19 DIAGNOSIS — F411 Generalized anxiety disorder: Secondary | ICD-10-CM | POA: Diagnosis not present

## 2018-05-20 DIAGNOSIS — R5381 Other malaise: Secondary | ICD-10-CM | POA: Diagnosis not present

## 2018-05-20 DIAGNOSIS — N926 Irregular menstruation, unspecified: Secondary | ICD-10-CM | POA: Diagnosis not present

## 2018-05-20 DIAGNOSIS — E039 Hypothyroidism, unspecified: Secondary | ICD-10-CM | POA: Diagnosis not present

## 2018-05-20 DIAGNOSIS — R5383 Other fatigue: Secondary | ICD-10-CM | POA: Diagnosis not present

## 2018-05-28 DIAGNOSIS — F411 Generalized anxiety disorder: Secondary | ICD-10-CM | POA: Diagnosis not present

## 2018-05-30 DIAGNOSIS — R05 Cough: Secondary | ICD-10-CM | POA: Diagnosis not present

## 2018-06-05 DIAGNOSIS — R05 Cough: Secondary | ICD-10-CM | POA: Diagnosis not present

## 2018-07-30 DIAGNOSIS — D2271 Melanocytic nevi of right lower limb, including hip: Secondary | ICD-10-CM | POA: Diagnosis not present

## 2018-07-30 DIAGNOSIS — D2272 Melanocytic nevi of left lower limb, including hip: Secondary | ICD-10-CM | POA: Diagnosis not present

## 2018-07-30 DIAGNOSIS — L719 Rosacea, unspecified: Secondary | ICD-10-CM | POA: Diagnosis not present

## 2018-07-30 DIAGNOSIS — L723 Sebaceous cyst: Secondary | ICD-10-CM | POA: Diagnosis not present

## 2018-07-31 DIAGNOSIS — R5381 Other malaise: Secondary | ICD-10-CM | POA: Diagnosis not present

## 2018-07-31 DIAGNOSIS — N926 Irregular menstruation, unspecified: Secondary | ICD-10-CM | POA: Diagnosis not present

## 2018-08-07 DIAGNOSIS — R5381 Other malaise: Secondary | ICD-10-CM | POA: Diagnosis not present

## 2018-08-07 DIAGNOSIS — N926 Irregular menstruation, unspecified: Secondary | ICD-10-CM | POA: Diagnosis not present

## 2018-08-07 DIAGNOSIS — R5383 Other fatigue: Secondary | ICD-10-CM | POA: Diagnosis not present

## 2018-08-07 DIAGNOSIS — E039 Hypothyroidism, unspecified: Secondary | ICD-10-CM | POA: Diagnosis not present

## 2018-09-04 DIAGNOSIS — M791 Myalgia, unspecified site: Secondary | ICD-10-CM | POA: Diagnosis not present

## 2018-09-04 DIAGNOSIS — R51 Headache: Secondary | ICD-10-CM | POA: Diagnosis not present

## 2018-09-04 DIAGNOSIS — M9901 Segmental and somatic dysfunction of cervical region: Secondary | ICD-10-CM | POA: Diagnosis not present

## 2018-09-04 DIAGNOSIS — M9902 Segmental and somatic dysfunction of thoracic region: Secondary | ICD-10-CM | POA: Diagnosis not present

## 2018-09-12 DIAGNOSIS — M791 Myalgia, unspecified site: Secondary | ICD-10-CM | POA: Diagnosis not present

## 2018-09-12 DIAGNOSIS — R51 Headache: Secondary | ICD-10-CM | POA: Diagnosis not present

## 2018-09-12 DIAGNOSIS — M9902 Segmental and somatic dysfunction of thoracic region: Secondary | ICD-10-CM | POA: Diagnosis not present

## 2018-09-12 DIAGNOSIS — M9901 Segmental and somatic dysfunction of cervical region: Secondary | ICD-10-CM | POA: Diagnosis not present

## 2018-09-15 DIAGNOSIS — N959 Unspecified menopausal and perimenopausal disorder: Secondary | ICD-10-CM | POA: Diagnosis not present

## 2018-09-15 DIAGNOSIS — Z3040 Encounter for surveillance of contraceptives, unspecified: Secondary | ICD-10-CM | POA: Diagnosis not present

## 2018-09-19 DIAGNOSIS — M9901 Segmental and somatic dysfunction of cervical region: Secondary | ICD-10-CM | POA: Diagnosis not present

## 2018-09-19 DIAGNOSIS — M791 Myalgia, unspecified site: Secondary | ICD-10-CM | POA: Diagnosis not present

## 2018-09-19 DIAGNOSIS — R51 Headache: Secondary | ICD-10-CM | POA: Diagnosis not present

## 2018-09-19 DIAGNOSIS — M9902 Segmental and somatic dysfunction of thoracic region: Secondary | ICD-10-CM | POA: Diagnosis not present

## 2018-10-07 ENCOUNTER — Other Ambulatory Visit: Payer: Self-pay

## 2018-10-07 ENCOUNTER — Encounter: Payer: Self-pay | Admitting: Sports Medicine

## 2018-10-07 ENCOUNTER — Ambulatory Visit (INDEPENDENT_AMBULATORY_CARE_PROVIDER_SITE_OTHER): Payer: BC Managed Care – PPO | Admitting: Sports Medicine

## 2018-10-07 VITALS — BP 96/72 | Ht 64.5 in | Wt 150.0 lb

## 2018-10-07 DIAGNOSIS — G8929 Other chronic pain: Secondary | ICD-10-CM

## 2018-10-07 DIAGNOSIS — M216X1 Other acquired deformities of right foot: Secondary | ICD-10-CM | POA: Diagnosis not present

## 2018-10-07 DIAGNOSIS — M2141 Flat foot [pes planus] (acquired), right foot: Secondary | ICD-10-CM | POA: Diagnosis not present

## 2018-10-07 DIAGNOSIS — M25561 Pain in right knee: Secondary | ICD-10-CM | POA: Diagnosis not present

## 2018-10-07 DIAGNOSIS — M2142 Flat foot [pes planus] (acquired), left foot: Secondary | ICD-10-CM

## 2018-10-07 DIAGNOSIS — M216X2 Other acquired deformities of left foot: Secondary | ICD-10-CM

## 2018-10-07 DIAGNOSIS — M25562 Pain in left knee: Secondary | ICD-10-CM

## 2018-10-07 NOTE — Progress Notes (Addendum)
PCP: Jonnie Kind, MD  Subjective:   HPI: Patient is a 48 y.o. female here for follow-up of bilateral foot pain as well as bilateral knee pain.  Patient's pain is been present for the last 6 to 12 months.  She recently picked up running in her 40s and developed bilateral foot pain which turned into bilateral knee pain following this.  Her foot pain is located at the first MTP joints bilaterally.  She was last seen in November and was given custom orthotics for pes planus deformity.  She notes this helped improve her pain significantly however she is now concerned about a developing bunion bilaterally.  She has no numbness or tingling in her feet.  She denies any specific injury or trauma.  She has no associated bruising or swelling.  Her knee pain was thought to be caused by weakness from her bilateral hip abductors and gait abnormality.  She was given home exercises to work on and she notes her hip flexors and abductors have improved and strength significantly.  Her knee pain does not radiate, she has no associated numbness or tingling.  She denies any bruising or swelling had no specific injury.  She notes her knee pain is improved considerably since her last visit now will only get knee pain intermittently.  If she gets any pain during a run it will oftentimes be gone by the end of her run.   Review of Systems: See HPI above.  Past Medical History:  Diagnosis Date  . Varicose veins   . Vertigo     Current Outpatient Medications on File Prior to Visit  Medication Sig Dispense Refill  . Cholecalciferol (VITAMIN D-3) 5000 UNITS TABS Take by mouth daily.    . Multiple Vitamin (MULTIVITAMIN) tablet Take 1 tablet by mouth daily.    Marland Kitchen NATURE-THROID 65 MG tablet     . Probiotic Product (PROBIOTIC DAILY PO) Take 25 mg by mouth daily.    . progesterone (PROMETRIUM) 100 MG capsule      No current facility-administered medications on file prior to visit.     Past Surgical History:   Procedure Laterality Date  . CESAREAN SECTION  Nov. 2009  . laproscopic surgery to remove IUD  March 2010    Allergies  Allergen Reactions  . Percocet [Oxycodone-Acetaminophen] Nausea And Vomiting    Social History   Socioeconomic History  . Marital status: Divorced    Spouse name: Not on file  . Number of children: Not on file  . Years of education: Not on file  . Highest education level: Not on file  Occupational History  . Not on file  Social Needs  . Financial resource strain: Not on file  . Food insecurity    Worry: Not on file    Inability: Not on file  . Transportation needs    Medical: Not on file    Non-medical: Not on file  Tobacco Use  . Smoking status: Never Smoker  . Smokeless tobacco: Never Used  Substance and Sexual Activity  . Alcohol use: No  . Drug use: No  . Sexual activity: Yes  Lifestyle  . Physical activity    Days per week: Not on file    Minutes per session: Not on file  . Stress: Not on file  Relationships  . Social Musician on phone: Not on file    Gets together: Not on file    Attends religious service: Not on file  Active member of club or organization: Not on file    Attends meetings of clubs or organizations: Not on file    Relationship status: Not on file  . Intimate partner violence    Fear of current or ex partner: Not on file    Emotionally abused: Not on file    Physically abused: Not on file    Forced sexual activity: Not on file  Other Topics Concern  . Not on file  Social History Narrative  . Not on file    Family History  Problem Relation Age of Onset  . Deep vein thrombosis Mother         Objective:  Physical Exam: BP 96/72   Ht 5' 4.5" (1.638 m)   Wt 150 lb (68 kg)   BMI 25.35 kg/m  Gen: NAD, comfortable in exam room Lungs: Breathing comfortably on room air Knee Exam Bilateral -Inspection: no deformity, no discoloration -Palpation: medial joint line: Non-tender; lateral joint line:  non-tender; quad tendon: non-tender; patella: non-tender; patellar tendon: non-tendon -ROM: Extension: -10 degrees; Flexion: 150 degrees -Strength: Extension: 5/5; Flexion: 5/5 -Special Tests: Varus Stress: Negative; Valgus Stress: Negative; Lachman: Negative; Posterior drawer: Negative; McMurray: Negative -Limbs neurovascularly intact, no instability noted  Ankle/Foot Exam Bilateral -Inspection: Loss of both longitudinal and transverse arches with splaying of the first and second toes bilaterally.  There is calcaneal valgus deformity. -Palpation: No tenderness palpation -ROM: Dorsiflexion: 20 degrees; plantarflexion: 50 degrees; Inversion: 35 degrees; Eversion: 25 degrees -Strength: Dorsiflexion: 5/5; Plantarflexion: 5/5; Inversion: 5/5; Eversion: 5/5 -Special Tests: Normal motion of first MTP joints on left.  Slightly reduced first MTP motion on right -Limb neurovascularly intact -Gait: Forefoot pronation with external rotation of the right foot > Lt foot    Assessment & Plan:  Patient is a 48 y.o. female here for follow-up of bilateral foot pain and bilateral knee pain  1.  Bilateral foot pain secondary to pes planus deformity and pronation of both feet with running - Orthotics adjusted to include both the medial heel wedge and first ray post.  Following these modifications patient's gait is returned to neutral.  She still has slight external rotation of her right foot with running -Patient is directed to call the clinic if she has any problems with the adjustments to the orthotics. -Patient to work with a running coach to help correct some of the pronation as well as forefoot valgus deformity  2.  Bilateral knee pain secondary to foot abnormalities described above -Patient has now improved her hip and quad strength.  She had significant provement of her knee pain but still has ongoing lateral knee pain secondary to her gait - Orthotic modifications as above -Patient to continue  working on her home therapy exercises  Patient to follow-up on an as-needed basis  I observed and examined the patient with Dr. Sheppard Coil and agree with assessment and plan.  Note reviewed and modified by me. Ila Mcgill, MD  Patient returns on October 21, 2018 for orthotic adjustment.  She has been developing increased pressure on the medial aspect of her great toes bilaterally.  Because of this the first ray post on both feet was shortened using the grinder.  Patient was observed walking and running after this orthotic adjustment and had neutral gait.  She notes this unloading the pressure off the medial aspect of her great toes.  She will return on an as-needed basis for further orthotic adjustments. Inez Catalina, MD

## 2018-10-07 NOTE — Patient Instructions (Signed)
Your foot and knee pain are caused by gait abnormalities and breakdown of the arches in your foot -When you run you have forefoot valgus and pronate your feet.  When you work with a running coach please let them know this so they can help make adjustments as needed - Adjustments were made to orthotics today have corrected some of the gait abnormalities described above. -If your orthotics become uncomfortable please let us know and we can make adjustments as needed -Adjustments in your orthotic should help with your knee pain as well

## 2018-10-21 ENCOUNTER — Ambulatory Visit (INDEPENDENT_AMBULATORY_CARE_PROVIDER_SITE_OTHER): Payer: BC Managed Care – PPO | Admitting: Sports Medicine

## 2018-10-21 ENCOUNTER — Other Ambulatory Visit: Payer: Self-pay

## 2018-10-21 VITALS — BP 112/82 | Ht 65.0 in | Wt 150.0 lb

## 2018-10-21 DIAGNOSIS — M216X2 Other acquired deformities of left foot: Secondary | ICD-10-CM

## 2018-10-21 DIAGNOSIS — M216X1 Other acquired deformities of right foot: Secondary | ICD-10-CM

## 2018-10-28 DIAGNOSIS — Z30431 Encounter for routine checking of intrauterine contraceptive device: Secondary | ICD-10-CM | POA: Diagnosis not present

## 2018-10-28 DIAGNOSIS — Z30432 Encounter for removal of intrauterine contraceptive device: Secondary | ICD-10-CM | POA: Insufficient documentation

## 2018-10-28 DIAGNOSIS — Z113 Encounter for screening for infections with a predominantly sexual mode of transmission: Secondary | ICD-10-CM | POA: Diagnosis not present

## 2018-10-28 DIAGNOSIS — Z30430 Encounter for insertion of intrauterine contraceptive device: Secondary | ICD-10-CM | POA: Diagnosis not present

## 2018-11-05 NOTE — Progress Notes (Signed)
Patient cancelled appt.

## 2018-11-05 NOTE — Patient Instructions (Signed)
Patient cancelled appt.

## 2018-11-17 DIAGNOSIS — M9902 Segmental and somatic dysfunction of thoracic region: Secondary | ICD-10-CM | POA: Diagnosis not present

## 2018-11-17 DIAGNOSIS — M791 Myalgia, unspecified site: Secondary | ICD-10-CM | POA: Diagnosis not present

## 2018-11-17 DIAGNOSIS — M9901 Segmental and somatic dysfunction of cervical region: Secondary | ICD-10-CM | POA: Diagnosis not present

## 2018-12-01 DIAGNOSIS — M791 Myalgia, unspecified site: Secondary | ICD-10-CM | POA: Diagnosis not present

## 2018-12-01 DIAGNOSIS — M9902 Segmental and somatic dysfunction of thoracic region: Secondary | ICD-10-CM | POA: Diagnosis not present

## 2018-12-01 DIAGNOSIS — M9901 Segmental and somatic dysfunction of cervical region: Secondary | ICD-10-CM | POA: Diagnosis not present

## 2018-12-04 DIAGNOSIS — Z30431 Encounter for routine checking of intrauterine contraceptive device: Secondary | ICD-10-CM | POA: Diagnosis not present

## 2018-12-08 DIAGNOSIS — E039 Hypothyroidism, unspecified: Secondary | ICD-10-CM | POA: Diagnosis not present

## 2018-12-08 DIAGNOSIS — N926 Irregular menstruation, unspecified: Secondary | ICD-10-CM | POA: Diagnosis not present

## 2018-12-08 DIAGNOSIS — R5381 Other malaise: Secondary | ICD-10-CM | POA: Diagnosis not present

## 2018-12-18 DIAGNOSIS — E039 Hypothyroidism, unspecified: Secondary | ICD-10-CM | POA: Diagnosis not present

## 2018-12-18 DIAGNOSIS — R5381 Other malaise: Secondary | ICD-10-CM | POA: Diagnosis not present

## 2018-12-18 DIAGNOSIS — R5383 Other fatigue: Secondary | ICD-10-CM | POA: Diagnosis not present

## 2018-12-18 DIAGNOSIS — R6882 Decreased libido: Secondary | ICD-10-CM | POA: Diagnosis not present

## 2019-05-05 ENCOUNTER — Ambulatory Visit: Payer: Self-pay

## 2019-05-05 ENCOUNTER — Other Ambulatory Visit: Payer: Self-pay

## 2019-05-05 ENCOUNTER — Encounter: Payer: Self-pay | Admitting: Orthopaedic Surgery

## 2019-05-05 ENCOUNTER — Ambulatory Visit: Payer: BC Managed Care – PPO | Admitting: Orthopaedic Surgery

## 2019-05-05 DIAGNOSIS — M25521 Pain in right elbow: Secondary | ICD-10-CM

## 2019-05-05 DIAGNOSIS — M25551 Pain in right hip: Secondary | ICD-10-CM

## 2019-05-05 NOTE — Progress Notes (Signed)
Office Visit Note   Patient: Sabrina Nichols           Date of Birth: Apr 27, 1970           MRN: [id number] Visit Date: 05/05/2019              Requested by: Jonnie Kind, MD [address] [place],  [place] [address] PCP: Jonnie Kind, MD   Assessment & Plan: Visit Diagnoses:  1. Pain in right elbow   2. Pain in right hip     Plan: Impression for the right elbow is lateral epicondylitis and for the right hip is gluteal musculature strain.  For the elbow I have demonstrated some exercises for her to do at home.  She will also try counterforce brace and Voltaren gel.  NSAIDs as needed.  For the hip relative rest from the running.  I think is fine to continue with a dry needling and physical therapy.  NSAIDs as needed.  Questions encouraged and answered.  Follow-up as needed.  Follow-Up Instructions: Return if symptoms worsen or fail to improve.   Orders:  Orders Placed This Encounter  Procedures  . XR HIP UNILAT W OR W/O PELVIS 2-3 VIEWS RIGHT  . XR Elbow Complete Right (3+View)   No orders of the defined types were placed in this encounter.     Procedures: No procedures performed   Clinical Data: No additional findings.   Subjective: Chief Complaint  Patient presents with  . Right Elbow - Pain    Mariellen returns today for evaluation of right elbow pain and right hip pain.  She had a mechanical fall about 2 months ago and she thinks the elbow pain may have started around that time.  She is also had worsening right hip pain along the pelvic brim and just distal to it.  She runs about 2 miles each time.  She has been doing dry needling with physical therapy.  She denies any groin pain or any radicular symptoms.  The elbow pain is lateral and is worse with bumping it and picking up things.  Denies any numbness and tingling or radiation of pain.  She has taken Advil as needed.   Review of Systems  Constitutional: Negative.   HENT: Negative.   Eyes:  Negative.   Respiratory: Negative.   Cardiovascular: Negative.   Endocrine: Negative.   Musculoskeletal: Negative.   Neurological: Negative.   Hematological: Negative.   Psychiatric/Behavioral: Negative.   All other systems reviewed and are negative.    Objective: Vital Signs: There were no vitals taken for this visit.  Physical Exam Vitals and nursing note reviewed.  Constitutional:      Appearance: She is well-developed.  Pulmonary:     Effort: Pulmonary effort is normal.  Skin:    General: Skin is warm.     Capillary Refill: Capillary refill takes less than 2 seconds.  Neurological:     Mental Status: She is alert and oriented to person, place, and time.  Psychiatric:        Behavior: Behavior normal.        Thought Content: Thought content normal.        Judgment: Judgment normal.     Ortho Exam Right elbow shows no swelling.  Full range of motion without pain.  Olecranon and medial epicondyle are nontender.  Mild to moderate tenderness in the common extensor tendons.  Mild pain with resisted index finger extension.  Negative Tinel over radial tunnel.  Right hip shows full range of motion.  No groin pain.  Negative Stinchfield sign.  Slight tenderness palpation just distal to the right iliac wing.  No motor or sensory deficits.  Specialty Comments:  No specialty comments available.  Imaging: XR Elbow Complete Right (3+View)  Result Date: 05/05/2019 No acute or structural abnormalities.    PMFS History: Patient Active Problem List   Diagnosis Date Noted  . Chronic pain of both knees 11/21/2017  . 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 Chronic venous insufficiency 08/01/2012  . Pes planus 09/16/2008  . GAIT DISTURBANCE 09/16/2008   Past Medical History:  Diagnosis Date  . Varicose veins   . Vertigo     Family History  Problem Relation Age of Onset  . Deep vein thrombosis Mother     Past Surgical  History:  Procedure Laterality Date  . CESAREAN SECTION  Nov. 2009  . laproscopic surgery to remove IUD  March 2010   Social History   Occupational History  . Not on file  Tobacco Use  . Smoking status: Never Smoker  . Smokeless tobacco: Never Used  Substance and Sexual Activity  . Alcohol use: No  . Drug use: No  . Sexual activity: Yes

## 2019-08-27 ENCOUNTER — Other Ambulatory Visit: Payer: Self-pay

## 2019-08-27 ENCOUNTER — Ambulatory Visit (INDEPENDENT_AMBULATORY_CARE_PROVIDER_SITE_OTHER): Payer: BC Managed Care – PPO | Admitting: Podiatry

## 2019-08-27 ENCOUNTER — Encounter: Payer: Self-pay | Admitting: Podiatry

## 2019-08-27 DIAGNOSIS — L600 Ingrowing nail: Secondary | ICD-10-CM

## 2019-08-27 NOTE — Patient Instructions (Signed)

## 2019-09-01 NOTE — Progress Notes (Signed)
Subjective:   Patient ID: Sabrina Nichols, female   DOB: 49 y.o.   MRN: [id number]   HPI Patient presents stating she has had a damaged second nail right and its been thick and she has been hoping for it to grow better.  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  I recommended and discussed permanent nail removal but she does not want this and wants to allow a new nail to regrow and I did explain that most likely will grow abnormally and I explained that to her.  At this point I went ahead and I am going to remove it temporarily we will see the results and ultimately decide if anything else is necessary.  Today I went ahead and infiltrated the right second toe 60 mg Xylocaine Marcaine mixture and sterile application to the digit and I then went ahead and using sterile instrumentation I remove the nail flushed it out and applied sterile dressing.  Gave instructions on soaks and will allow a new nail to regrow and see how it grows and ultimately may require permanent procedure which she is completely aware of at this time

## 2019-09-08 ENCOUNTER — Encounter (INDEPENDENT_AMBULATORY_CARE_PROVIDER_SITE_OTHER): Payer: Self-pay | Admitting: Internal Medicine

## 2019-09-08 ENCOUNTER — Ambulatory Visit (INDEPENDENT_AMBULATORY_CARE_PROVIDER_SITE_OTHER): Payer: BC Managed Care – PPO | Admitting: Internal Medicine

## 2019-09-08 ENCOUNTER — Other Ambulatory Visit: Payer: Self-pay

## 2019-09-08 VITALS — BP 100/66 | HR 67 | Temp 98.1°F | Resp 18 | Ht 65.0 in | Wt 156.0 lb

## 2019-09-08 DIAGNOSIS — E2839 Other primary ovarian failure: Secondary | ICD-10-CM

## 2019-09-08 DIAGNOSIS — Z131 Encounter for screening for diabetes mellitus: Secondary | ICD-10-CM

## 2019-09-08 DIAGNOSIS — E559 Vitamin D deficiency, unspecified: Secondary | ICD-10-CM

## 2019-09-08 DIAGNOSIS — R5381 Other malaise: Secondary | ICD-10-CM | POA: Diagnosis not present

## 2019-09-08 DIAGNOSIS — E039 Hypothyroidism, unspecified: Secondary | ICD-10-CM | POA: Diagnosis not present

## 2019-09-08 DIAGNOSIS — F520 Hypoactive sexual desire disorder: Secondary | ICD-10-CM

## 2019-09-08 DIAGNOSIS — Z13220 Encounter for screening for lipoid disorders: Secondary | ICD-10-CM

## 2019-09-08 DIAGNOSIS — R5383 Other fatigue: Secondary | ICD-10-CM

## 2019-09-08 NOTE — Progress Notes (Signed)
Metrics: Intervention Frequency ACO  Documented Smoking Status Yearly  Screened one or more times in 24 months  Cessation Counseling or  Active cessation medication Past 24 months  Past 24 months   Guideline developer: UpToDate (See UpToDate for funding source) Date Released: 2014       Wellness Office Visit  Subjective:  Patient ID: Sabrina Nichols, female    DOB: [birth year]  Age: 49 y.o. MRN: [id number]  CC: This very pleasant 49 year old lady comes to our practice as a new patient to establish care. HPI  She has had menopausal symptoms and her last menstrual period was in March of this year.  She was seeing another practice and is already on bioidentical hormone therapy with estradiol and progesterone. She also had testosterone pellet inserted in June of this year.  She felt very well initially but now she does not feel as well. She also has a diagnosis of hypothyroidism and is on desiccated NP thyroid. She comes to me today to modify and help her with hormone therapy. She tries to eat healthy and exercises every day. She also takes vitamin D3 10,000 units for vitamin D deficiency. Past Medical History:  Diagnosis Date  . Varicose veins   . Vertigo    Past Surgical History:  Procedure Laterality Date  . CESAREAN SECTION  Nov. 2009  . laproscopic surgery to remove IUD  March 2010     Family History  Problem Relation Age of Onset  . 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   Social History   Tobacco Use  . Smoking status: Never Smoker  . Smokeless tobacco: Never Used  Substance Use Topics  . Alcohol use: Yes    Comment: occ    Current Meds  Medication Sig  . B-Complex CAPS Take 1 capsule by mouth daily.   Claris Gower Grape-Goldenseal (BERBERINE COMPLEX) 200-200-50 MG CAPS Take 1 capsule by mouth daily.   . Boron 3 MG CAPS  Take 1 capsule by mouth daily.   . Cholecalciferol (VITAMIN D3) 250 MCG (10000 UT) capsule Take 10,000 Units by mouth daily.   Marland Kitchen estradiol (ESTRACE) 1 MG tablet Take 1 mg by mouth daily.   . L-Glutamine 500 MG CAPS Take 1 capsule by mouth daily.   . NONFORMULARY OR COMPOUNDED ITEM Inject 75 mg as directed. Injected testosterone pellet into IM  . Omega 3 1200 MG CAPS Take 1 capsule by mouth daily at 12 noon.   . progesterone (PROMETRIUM) 200 MG capsule   . thyroid (NP THYROID) 90 MG tablet Take 90 mg by mouth daily. In AM.     No flowsheet data found.   Objective:   Today's Vitals: BP 100/66 (BP Location: Left Arm, Patient Position: Sitting, Cuff Size: Normal)   Pulse 67   Temp 98.1 F (36.7 C) (Temporal)   Resp 18   Ht 5\' 5"  (1.651 m)   Wt 156 lb (70.8 kg)   SpO2 98%   BMI 25.96 kg/m  Vitals with BMI 09/08/2019 10/21/2018 10/07/2018  Height 5\' 5"  5\' 5"  5' 4.5"  Weight 156 lbs 150 lbs 150 lbs  BMI 25.96 24.96 25.36  Systolic 100 112 96  Diastolic 66 82 72  Pulse 67 - -     Physical Exam  She looks systemically well.  Blood pressure is excellent.     Assessment   1. Hypothyroidism,  adult   2. Primary ovarian failure   3. Hypoactive sexual desire disorder   4. Malaise and fatigue   5. Vitamin D deficiency disease   6. Screening for diabetes mellitus   7. Screening for lipoid disorders       Tests ordered Orders Placed This Encounter  Procedures  . CBC  . COMPLETE METABOLIC PANEL WITH GFR  . Estradiol  . Hemoglobin A1c  . Lipid panel  . Progesterone  . T3, free  . T4  . TSH  . Testos,Total,Free and SHBG (Female)  . VITAMIN D 25 Hydroxy (Vit-D Deficiency, Fractures)     Plan: 1. Blood work is ordered. 2. She will continue with all the medications that she is on and I will see her for follow-up once the blood tests are available in the next couple of weeks to discuss further. 3. Today I spent 30 minutes with this patient reviewing the results that she  brought with her and her overall health and answering questions she had, some of which involved COVID-19 vaccination.   No orders of the defined types were placed in this encounter.   Wilson Singer, MD

## 2019-09-12 LAB — TESTOS,TOTAL,FREE AND SHBG (FEMALE)
Free Testosterone: 4.3 pg/mL (ref 0.1–6.4)
Sex Hormone Binding: 260 nmol/L — ABNORMAL HIGH (ref 17–124)
Testosterone, Total, LC-MS-MS: 99 ng/dL — ABNORMAL HIGH (ref 2–45)

## 2019-09-12 LAB — HEMOGLOBIN A1C
Hgb A1c MFr Bld: 5.2 % of total Hgb (ref <5.7)
Mean Plasma Glucose: 103 (calc)
eAG (mmol/L): 5.7 (calc)

## 2019-09-12 LAB — COMPLETE METABOLIC PANEL WITH GFR
AG Ratio: 1.5 (calc) (ref 1.0–2.5)
ALT: 12 U/L (ref 6–29)
AST: 15 U/L (ref 10–35)
Albumin: 4.3 g/dL (ref 3.6–5.1)
Alkaline phosphatase (APISO): 39 U/L (ref 31–125)
BUN: 12 mg/dL (ref 7–25)
CO2: 29 mmol/L (ref 20–32)
Calcium: 9.9 mg/dL (ref 8.6–10.2)
Chloride: 105 mmol/L (ref 98–110)
Creat: 0.93 mg/dL (ref 0.50–1.10)
GFR, Est African American: 84 mL/min/{1.73_m2} (ref >=60)
GFR, Est Non African American: 73 mL/min/{1.73_m2} (ref >=60)
Globulin: 2.8 g/dL (calc) (ref 1.9–3.7)
Glucose, Bld: 97 mg/dL (ref 65–99)
Potassium: 5.4 mmol/L — ABNORMAL HIGH (ref 3.5–5.3)
Sodium: 141 mmol/L (ref 135–146)
Total Bilirubin: 0.4 mg/dL (ref 0.2–1.2)
Total Protein: 7.1 g/dL (ref 6.1–8.1)

## 2019-09-12 LAB — CBC
HCT: 37.2 % (ref 35.0–45.0)
Hemoglobin: 12.5 g/dL (ref 11.7–15.5)
MCH: 31.1 pg (ref 27.0–33.0)
MCHC: 33.6 g/dL (ref 32.0–36.0)
MCV: 92.5 fL (ref 80.0–100.0)
MPV: 10.6 fL (ref 7.5–12.5)
Platelets: 243 10*3/uL (ref 140–400)
RBC: 4.02 10*6/uL (ref 3.80–5.10)
RDW: 12.4 % (ref 11.0–15.0)
WBC: 6.4 10*3/uL (ref 3.8–10.8)

## 2019-09-12 LAB — ESTRADIOL: Estradiol: 407 pg/mL — ABNORMAL HIGH

## 2019-09-12 LAB — T3, FREE: T3, Free: 4 pg/mL (ref 2.3–4.2)

## 2019-09-12 LAB — VITAMIN D 25 HYDROXY (VIT D DEFICIENCY, FRACTURES): Vit D, 25-Hydroxy: 44 ng/mL (ref 30–100)

## 2019-09-12 LAB — T4: T4, Total: 7.1 ug/dL (ref 5.1–11.9)

## 2019-09-12 LAB — LIPID PANEL
Cholesterol: 182 mg/dL (ref <200)
HDL: 78 mg/dL (ref >=50)
LDL Cholesterol (Calc): 84 mg/dL (calc)
Non-HDL Cholesterol (Calc): 104 mg/dL (calc) (ref <130)
Total CHOL/HDL Ratio: 2.3 (calc) (ref <5.0)
Triglycerides: 105 mg/dL (ref <150)

## 2019-09-12 LAB — PROGESTERONE: Progesterone: 19.6 ng/mL

## 2019-09-12 LAB — TSH: TSH: 1.22 mIU/L

## 2019-09-14 ENCOUNTER — Encounter (INDEPENDENT_AMBULATORY_CARE_PROVIDER_SITE_OTHER): Payer: Self-pay | Admitting: Internal Medicine

## 2019-09-24 ENCOUNTER — Other Ambulatory Visit: Payer: Self-pay

## 2019-09-24 ENCOUNTER — Encounter (INDEPENDENT_AMBULATORY_CARE_PROVIDER_SITE_OTHER): Payer: Self-pay | Admitting: Internal Medicine

## 2019-09-24 ENCOUNTER — Ambulatory Visit (INDEPENDENT_AMBULATORY_CARE_PROVIDER_SITE_OTHER): Payer: BC Managed Care – PPO | Admitting: Internal Medicine

## 2019-09-24 VITALS — BP 100/70 | HR 66 | Temp 97.9°F | Ht 65.0 in | Wt 160.2 lb

## 2019-09-24 DIAGNOSIS — F520 Hypoactive sexual desire disorder: Secondary | ICD-10-CM

## 2019-09-24 DIAGNOSIS — E559 Vitamin D deficiency, unspecified: Secondary | ICD-10-CM | POA: Diagnosis not present

## 2019-09-24 DIAGNOSIS — E039 Hypothyroidism, unspecified: Secondary | ICD-10-CM

## 2019-09-24 DIAGNOSIS — E2839 Other primary ovarian failure: Secondary | ICD-10-CM

## 2019-09-24 MED ORDER — FIRST-TESTOSTERONE MC 2 % TD CREA: 5.0000 mg | TOPICAL_CREAM | Freq: Every day | TRANSDERMAL | 0 refills | Status: AC

## 2019-09-24 MED ORDER — NP THYROID 30 MG PO TABS: 30.0000 mg | ORAL_TABLET | Freq: Every day | ORAL | 1 refills | Status: DC

## 2019-09-24 NOTE — Patient Instructions (Signed)
[facility] Dietary Recommendations for Weight Loss What to Avoid . Avoid added sugars o Often added sugar can be found in processed foods such as many condiments, dry cereals, cakes, cookies, chips, crisps, crackers, candies, sweetened drinks, etc.  o Read labels and AVOID/DECREASE use of foods with the following in their ingredient list: Sugar, fructose, high fructose corn syrup, sucrose, glucose, maltose, dextrose, molasses, cane sugar, brown sugar, any type of syrup, agave nectar, etc.   . Avoid snacking in between meals . Avoid foods made with flour o If you are going to eat food made with flour, choose those made with whole-grains; and, minimize your consumption as much as is tolerable . Avoid processed foods o These foods are generally stocked in the middle of the grocery store. Focus on shopping on the perimeter of the grocery.  . Avoid Meat  o We recommend following a plant-based diet at [facility]. Thus, we recommend avoiding meat as a general rule. Consider eating beans, legumes, eggs, and/or dairy products for regular protein sources o If you plan on eating meat limit to 4 ounces of meat at a time and choose lean options such as Fish, chicken, turkey. Avoid red meat intake such as pork and/or steak What to Include . Vegetables o GREEN LEAFY VEGETABLES: Kale, spinach, mustard greens, collard greens, cabbage, broccoli, etc. o OTHER: Asparagus, cauliflower, eggplant, carrots, peas, Brussel sprouts, tomatoes, bell peppers, zucchini, beets, cucumbers, etc. . Grains, seeds, and legumes o Beans: kidney beans, black eyed peas, garbanzo beans, black beans, pinto beans, etc. o Whole, unrefined grains: brown rice, barley, bulgur, oatmeal, etc. . Healthy fats  o Avoid highly processed fats such as vegetable oil o Examples of healthy fats: avocado, olives, virgin olive oil, dark chocolate (?72% Cocoa), nuts (peanuts, almonds, walnuts, cashews, pecans, etc.) . None to Low  Intake of Animal Sources of Protein o Meat sources: chicken, turkey, salmon, tuna. Limit to 4 ounces of meat at one time. o Consider limiting dairy sources, but when choosing dairy focus on: PLAIN Greek yogurt, cottage cheese, high-protein milk . Fruit o Choose berries  When to Eat . Intermittent Fasting: o Choosing not to eat for a specific time period, but DO FOCUS ON HYDRATION when fasting o Multiple Techniques: - Time Restricted Eating: eat 3 meals in a day, each meal lasting no more than 60 minutes, no snacks between meals - 16-18 hour fast: fast for 16 to 18 hours up to 7 days a week. Often suggested to start with 2-3 nonconsecutive days per week.  . Remember the time you sleep is counted as fasting.  . Examples of eating schedule: Fast from 7:00pm-11:00am. Eat between 11:00am-7:00pm.  - 24-hour fast: fast for 24 hours up to every other day. Often suggested to start with 1 day per week . Remember the time you sleep is counted as fasting . Examples of eating schedule:  o Eating day: eat 2-3 meals on your eating day. If doing 2 meals, each meal should last no more than 90 minutes. If doing 3 meals, each meal should last no more than 60 minutes. Finish last meal by 7:00pm. o Fasting day: Fast until 7:00pm.  o IF YOU FEEL UNWELL FOR ANY REASON/IN ANY WAY WHEN FASTING, STOP FASTING BY EATING A NUTRITIOUS SNACK OR LIGHT MEAL o ALWAYS FOCUS ON HYDRATION DURING FASTS - Acceptable Hydration sources: water, broths, tea/coffee (black tea/coffee is best but using a small amount of whole-fat dairy products in coffee/tea is acceptable).  -   Poor Hydration Sources: anything with sugar or artificial sweeteners added to it  These recommendations have been developed for patients that are actively receiving medical care from either Dr. Lequan Dobratz or Sarah Gray, DNP, NP-C at [facility]. These recommendations are developed for patients with specific medical conditions and are not meant to be  distributed or used by others that are not actively receiving care from either provider listed above at [facility]. It is not appropriate to participate in the above eating plans without proper medical supervision.   Reference: Fung, J. The obesity code. [place]: [place]; 2016.   

## 2019-09-24 NOTE — Progress Notes (Signed)
Metrics: Intervention Frequency ACO  Documented Smoking Status Yearly  Screened one or more times in 24 months  Cessation Counseling or  Active cessation medication Past 24 months  Past 24 months   Guideline developer: UpToDate (See UpToDate for funding source) Date Released: 2014       Wellness Office Visit  Subjective:  Patient ID: Sabrina Nichols, female    DOB: 07-03-1970  Age: 49 y.o. MRN: [id number]  CC: This lady comes in for follow-up to discuss her blood work and further recommendations. HPI  She remains frustrated with her lack of weight loss and in fact weight gain.  She feels fatigued in the morning when she wakes up in her mood is inconsistent and fluctuating. I reviewed all her blood work with her.  Her vitamin D levels are not optimal. Estradiol levels are surprisingly quite high and she does describe breast tenderness with the estradiol 1 mg daily. Free testosterone levels are in the normal range and not optimal. Progesterone levels are in a good range. Her T3 levels are at the upper limit of normal.  Her TSH is not suppressed. On questioning regarding her diet, she does tend to eat animal protein on most days of the week. Past Medical History:  Diagnosis Date  . Varicose veins   . Vertigo    Past Surgical History:  Procedure Laterality Date  . CESAREAN SECTION  Nov. 2009  . laproscopic surgery to remove IUD  March 2010     Family History  Problem Relation Age of Onset  . 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   Social History   Tobacco Use  . Smoking status: Never Smoker  . Smokeless tobacco: Never Used  Substance Use Topics  . Alcohol use: Yes    Comment: occ    Current Meds  Medication Sig  . B-Complex CAPS Take 1 capsule by mouth daily.   Claris Gower Grape-Goldenseal (BERBERINE COMPLEX) 200-200-50 MG CAPS  Take 1 capsule by mouth daily.   . Boron 3 MG CAPS Take 1 capsule by mouth daily.   . Cholecalciferol (VITAMIN D3) 250 MCG (10000 UT) capsule Take 10,000 Units by mouth daily.   Marland Kitchen estradiol (ESTRACE) 1 MG tablet Take 1 mg by mouth daily.   . L-Glutamine 500 MG CAPS Take 1 capsule by mouth daily.   . NONFORMULARY OR COMPOUNDED ITEM Inject 75 mg as directed. Injected testosterone pellet into IM  . Omega 3 1200 MG CAPS Take 1 capsule by mouth daily at 12 noon.   . progesterone (PROMETRIUM) 200 MG capsule   . thyroid (NP THYROID) 90 MG tablet Take 90 mg by mouth daily. In AM.      No flowsheet data found.   Objective:   Today's Vitals: BP 100/70 (BP Location: Left Arm, Patient Position: Sitting, Cuff Size: Normal)   Pulse 66   Temp 97.9 F (36.6 C) (Temporal)   Ht 5\' 5"  (1.651 m)   Wt 160 lb 3.2 oz (72.7 kg)   SpO2 96%   BMI 26.66 kg/m  Vitals with BMI 09/24/2019 09/08/2019 10/21/2018  Height 5\' 5"  5\' 5"  5\' 5"   Weight 160 lbs 3 oz 156 lbs 150 lbs  BMI 26.66 25.96 24.96  Systolic 100 100 12/21/2018  Diastolic 70 66 82  Pulse 66 67 -     Physical Exam  She looks systemically well.  She has gained 4 pounds since last time I saw her.  Blood pressure is excellent.     Assessment   1. Hypothyroidism, adult   2. Primary ovarian failure   3. Hypoactive sexual desire disorder   4. Vitamin D deficiency disease       Tests ordered No orders of the defined types were placed in this encounter.    Plan: 1. I recommended that we optimize further her hypothyroidism and I am going to add NP thyroid 30 mg daily in addition to the NP thyroid 90 mg that she is taking already.  I explained possible side effects and how to deal with them. 2. I told her she can reduce her estradiol to 0.5 mg daily.  She will continue with the same dose of progesterone. 3. We are going to start her on testosterone cream and [facility] a dose of 5 mg apply to the labia  daily. 4. I recommended that she increase the vitamin D3 to 15,000 units daily. 5. We discussed nutrition in general and I discussed the concept of intermittent fasting which she has experienced with and I recommended that she do 16 hours every day combined with a more plant-based diet we discussed the blue zones emphasizing the importance of reducing animal protein and replacing with plant proteins such as beans. 6. I will see her for follow-up in about 2 months and she will get blood work done 2 weeks prior to that. 7. Today I spent 45 minutes with this patient going over all her results and recommendations above.   Meds ordered this encounter  Medications  . NP THYROID 30 MG tablet    Sig: Take 1 tablet (30 mg total) by mouth daily before breakfast.    Dispense:  90 tablet    Refill:  1  . Testosterone Propionate (FIRST-TESTOSTERONE MC) 2 % CREA    Sig: Place 5 mg onto the skin daily.    Dispense:  30 g    Refill:  0    Emalyn Schou Normajean Glasgow, MD

## 2019-09-30 ENCOUNTER — Encounter (INDEPENDENT_AMBULATORY_CARE_PROVIDER_SITE_OTHER): Payer: Self-pay | Admitting: Internal Medicine

## 2019-10-09 ENCOUNTER — Encounter (INDEPENDENT_AMBULATORY_CARE_PROVIDER_SITE_OTHER): Payer: Self-pay | Admitting: Internal Medicine

## 2019-10-09 ENCOUNTER — Other Ambulatory Visit (INDEPENDENT_AMBULATORY_CARE_PROVIDER_SITE_OTHER): Payer: Self-pay | Admitting: Internal Medicine

## 2019-11-11 ENCOUNTER — Other Ambulatory Visit (INDEPENDENT_AMBULATORY_CARE_PROVIDER_SITE_OTHER): Payer: Self-pay | Admitting: Internal Medicine

## 2019-11-11 ENCOUNTER — Encounter (INDEPENDENT_AMBULATORY_CARE_PROVIDER_SITE_OTHER): Payer: Self-pay | Admitting: Internal Medicine

## 2019-11-11 DIAGNOSIS — E039 Hypothyroidism, unspecified: Secondary | ICD-10-CM

## 2019-11-11 DIAGNOSIS — E2839 Other primary ovarian failure: Secondary | ICD-10-CM

## 2019-11-11 DIAGNOSIS — E559 Vitamin D deficiency, unspecified: Secondary | ICD-10-CM

## 2019-11-11 DIAGNOSIS — F520 Hypoactive sexual desire disorder: Secondary | ICD-10-CM

## 2019-11-19 ENCOUNTER — Other Ambulatory Visit (INDEPENDENT_AMBULATORY_CARE_PROVIDER_SITE_OTHER): Payer: Self-pay | Admitting: Internal Medicine

## 2019-11-26 LAB — TSH: TSH: 0.01 mIU/L — ABNORMAL LOW

## 2019-11-26 LAB — PROGESTERONE: Progesterone: 15.1 ng/mL

## 2019-11-26 LAB — COMPLETE METABOLIC PANEL WITH GFR
AG Ratio: 1.6 (calc) (ref 1.0–2.5)
ALT: 13 U/L (ref 6–29)
AST: 18 U/L (ref 10–35)
Albumin: 4.1 g/dL (ref 3.6–5.1)
Alkaline phosphatase (APISO): 37 U/L (ref 31–125)
BUN: 13 mg/dL (ref 7–25)
CO2: 30 mmol/L (ref 20–32)
Calcium: 9.7 mg/dL (ref 8.6–10.2)
Chloride: 107 mmol/L (ref 98–110)
Creat: 0.88 mg/dL (ref 0.50–1.10)
GFR, Est African American: 90 mL/min/{1.73_m2} (ref >=60)
GFR, Est Non African American: 78 mL/min/{1.73_m2} (ref >=60)
Globulin: 2.5 g/dL (calc) (ref 1.9–3.7)
Glucose, Bld: 97 mg/dL (ref 65–99)
Potassium: 4.2 mmol/L (ref 3.5–5.3)
Sodium: 142 mmol/L (ref 135–146)
Total Bilirubin: 0.5 mg/dL (ref 0.2–1.2)
Total Protein: 6.6 g/dL (ref 6.1–8.1)

## 2019-11-26 LAB — TESTOS,TOTAL,FREE AND SHBG (FEMALE)
Free Testosterone: 12 pg/mL — ABNORMAL HIGH (ref 0.1–6.4)
Sex Hormone Binding: 214 nmol/L — ABNORMAL HIGH (ref 17–124)
Testosterone, Total, LC-MS-MS: 222 ng/dL — ABNORMAL HIGH (ref 2–45)

## 2019-11-26 LAB — T4: T4, Total: 7.7 ug/dL (ref 5.1–11.9)

## 2019-11-26 LAB — T3, FREE: T3, Free: 5.1 pg/mL — ABNORMAL HIGH (ref 2.3–4.2)

## 2019-11-26 LAB — ESTRADIOL: Estradiol: 93 pg/mL

## 2019-11-26 LAB — VITAMIN D 25 HYDROXY (VIT D DEFICIENCY, FRACTURES): Vit D, 25-Hydroxy: 73 ng/mL (ref 30–100)

## 2019-11-30 ENCOUNTER — Encounter (INDEPENDENT_AMBULATORY_CARE_PROVIDER_SITE_OTHER): Payer: Self-pay | Admitting: Internal Medicine

## 2019-11-30 ENCOUNTER — Other Ambulatory Visit: Payer: Self-pay

## 2019-11-30 ENCOUNTER — Ambulatory Visit (INDEPENDENT_AMBULATORY_CARE_PROVIDER_SITE_OTHER): Payer: BC Managed Care – PPO | Admitting: Internal Medicine

## 2019-11-30 VITALS — BP 114/62 | HR 60 | Temp 97.9°F | Ht 65.0 in | Wt 160.0 lb

## 2019-11-30 DIAGNOSIS — F520 Hypoactive sexual desire disorder: Secondary | ICD-10-CM

## 2019-11-30 DIAGNOSIS — E2839 Other primary ovarian failure: Secondary | ICD-10-CM

## 2019-11-30 DIAGNOSIS — E039 Hypothyroidism, unspecified: Secondary | ICD-10-CM

## 2019-11-30 MED ORDER — NP THYROID 120 MG PO TABS: 120.0000 mg | ORAL_TABLET | Freq: Every day | ORAL | 1 refills | Status: DC

## 2019-11-30 NOTE — Progress Notes (Signed)
Metrics: Intervention Frequency ACO  Documented Smoking Status Yearly  Screened one or more times in 24 months  Cessation Counseling or  Active cessation medication Past 24 months  Past 24 months   Guideline developer: UpToDate (See UpToDate for funding source) Date Released: 2014       Wellness Office Visit  Subjective:  Patient ID: Sabrina Nichols, female    DOB: [birth year]  Age: 49 y.o. MRN: [id number]  CC: This lady comes in for follow-up of hypothyroidism, perimenopausal state, hypoactive sexual desire disorder. HPI  She has tolerated the adjustments we made and lowering the estradiol actually made her feel somewhat better.  In retrospect and discussing more with her, she did have a menstrual cycle since the last time we met so therefore she was actually not in the menopause at all.  This is why her estradiol level was elevated previously and she feels better with lowered estradiol level. She continues to take progesterone. She has tolerated testosterone cream and she overall feels better. She has tolerated desiccated NP thyroid. I reviewed all her blood work with her today showing fairly optimized levels. She continues to engage in mostly a plant-based diet and exercises on a regular basis. Past Medical History:  Diagnosis Date  . Varicose veins   . Vertigo    Past Surgical History:  Procedure Laterality Date  . CESAREAN SECTION  Nov. 2009  . laproscopic surgery to remove IUD  March 2010     Family History  Problem Relation Age of Onset  . 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   Social History   Tobacco Use  . Smoking status: Never Smoker  . Smokeless tobacco: Never Used  Substance Use Topics  . Alcohol use: Yes    Comment: occ    Current Meds  Medication Sig  . B-Complex CAPS Take 1 capsule by mouth daily.   .  Cholecalciferol (VITAMIN D3) 250 MCG (10000 UT) capsule Take 10,000 Units by mouth daily.   Marland Kitchen estradiol (ESTRACE) 1 MG tablet Take 0.5 mg by mouth daily.   . NONFORMULARY OR COMPOUNDED ITEM Inject 75 mg as directed. Injected testosterone pellet into IM  . NP THYROID 30 MG tablet TAKE 1 TABLET (30 MG TOTAL) BY MOUTH DAILY BEFORE BREAKFAST.  Marland Kitchen Omega 3 1200 MG CAPS Take 1 capsule by mouth daily at 12 noon.   . progesterone (PROMETRIUM) 200 MG capsule   . Testosterone Propionate (FIRST-TESTOSTERONE MC) 2 % CREA Place 5 mg onto the skin daily.  Marland Kitchen thyroid (NP THYROID) 90 MG tablet Take 90 mg by mouth daily. In AM.      Depression screen [facility] 2/9 11/30/2019  Decreased Interest 0  Down, Depressed, Hopeless 0  PHQ - 2 Score 0  Altered sleeping 0  Tired, decreased energy 0  Change in appetite 0  Feeling bad or failure about yourself  0  Trouble concentrating 0  Moving slowly or fidgety/restless 0  Suicidal thoughts 0  PHQ-9 Score 0  Difficult doing work/chores Not difficult at all     Objective:   Today's Vitals: BP 114/62   Pulse 60   Temp 97.9 F (36.6 C) (Temporal)   Ht 5' 5" (1.651 m)   Wt 160 lb (72.6 kg)   SpO2 96%   BMI 26.63 kg/m  Vitals with BMI 11/30/2019 09/24/2019 09/08/2019  Height 5' 5" 5' 5"  5' 5"  Weight 160 lbs 160 lbs 3 oz 156 lbs  BMI 26.63 43.83 81.84  Systolic 037 543 606  Diastolic 62 70 66  Pulse 60 66 67     Physical Exam  She looks systemically well.  Weight is largely unchanged.  Blood pressure is excellent.     Assessment   1. Hypothyroidism, adult   2. Primary ovarian failure   3. Hypoactive sexual desire disorder       Tests ordered No orders of the defined types were placed in this encounter.    Plan: 1. She will continue with the same dose of desiccated thyroid, NP thyroid 120 mg daily and I have sent a new prescription to reflect this change. 2. She will discontinue estradiol altogether for the time being but continue with  progesterone. 3. She will continue with testosterone cream as before. 4. Follow-up in about 3 to 4 months time.   Meds ordered this encounter  Medications  . NP THYROID 120 MG tablet    Sig: Take 1 tablet (120 mg total) by mouth daily before breakfast.    Dispense:  90 tablet    Refill:  1    Genieve Ramaswamy Luther Parody, MD

## 2019-12-31 ENCOUNTER — Other Ambulatory Visit (INDEPENDENT_AMBULATORY_CARE_PROVIDER_SITE_OTHER): Payer: Self-pay | Admitting: Internal Medicine

## 2019-12-31 ENCOUNTER — Encounter (INDEPENDENT_AMBULATORY_CARE_PROVIDER_SITE_OTHER): Payer: Self-pay | Admitting: Internal Medicine

## 2019-12-31 MED ORDER — CLOTRIMAZOLE-BETAMETHASONE 1-0.05 % EX CREA: 1.0000 "application " | TOPICAL_CREAM | Freq: Two times a day (BID) | CUTANEOUS | 0 refills | Status: DC

## 2020-01-11 IMAGING — US US ABDOMEN COMPLETE
1 series · 14 of 25 positions shown · non-contrast
Comparison: No recent.

CLINICAL DATA: Right upper quadrant pain.

EXAM:
ABDOMEN ULTRASOUND COMPLETE

[Series 1: us abdomen complete · 0.20mm/px · 14 of 83 slices shown]
[im 1/83]
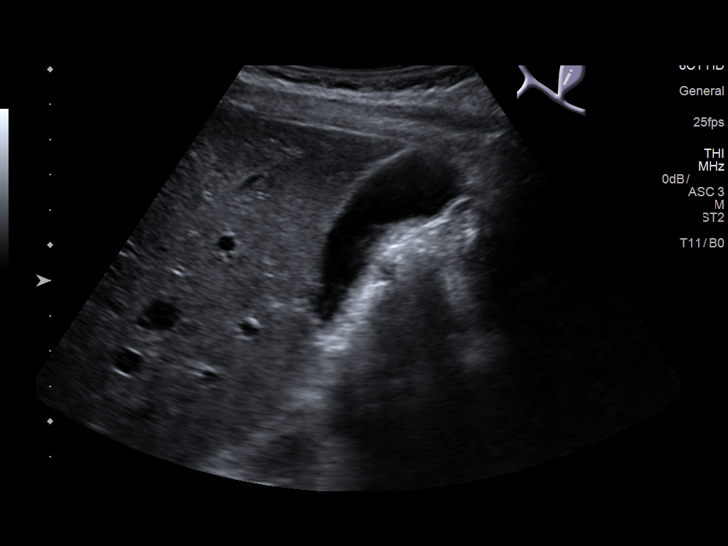
[im 7/83]
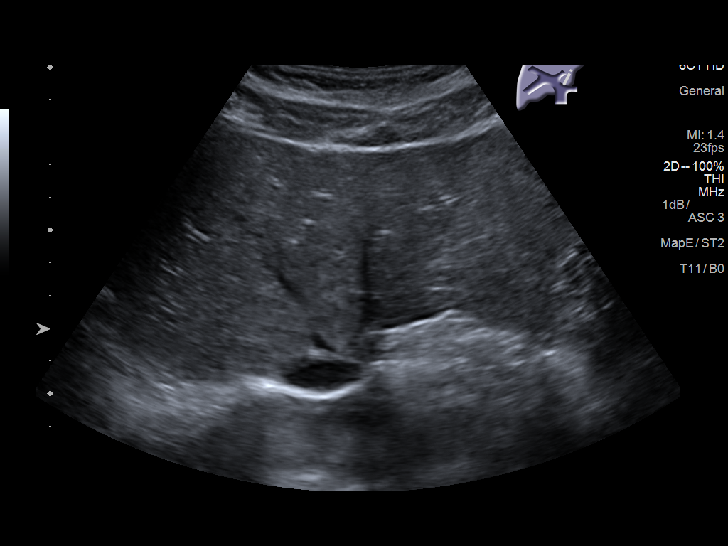
[im 14/83]
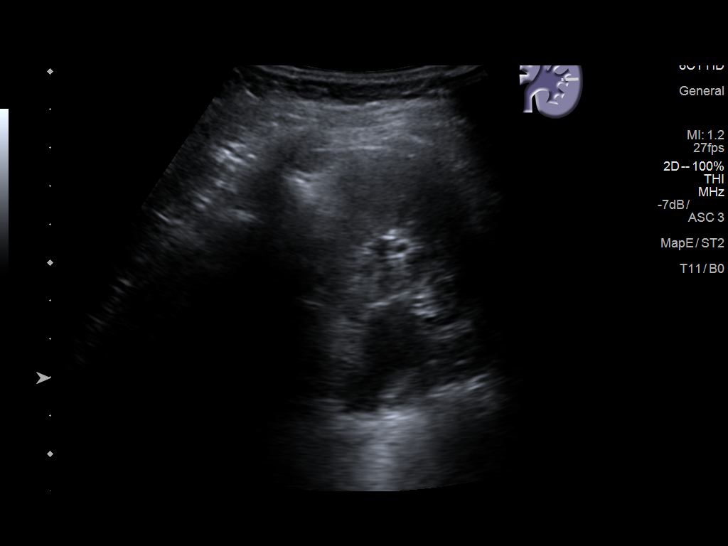
[im 21/83]
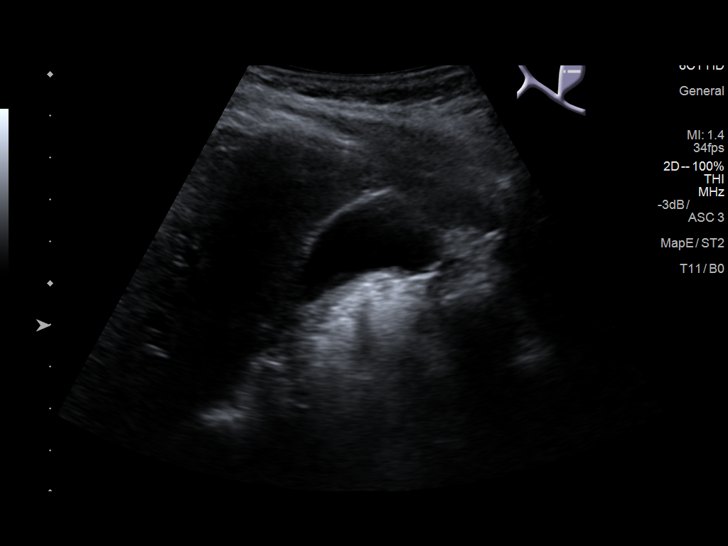
[im 28/83]
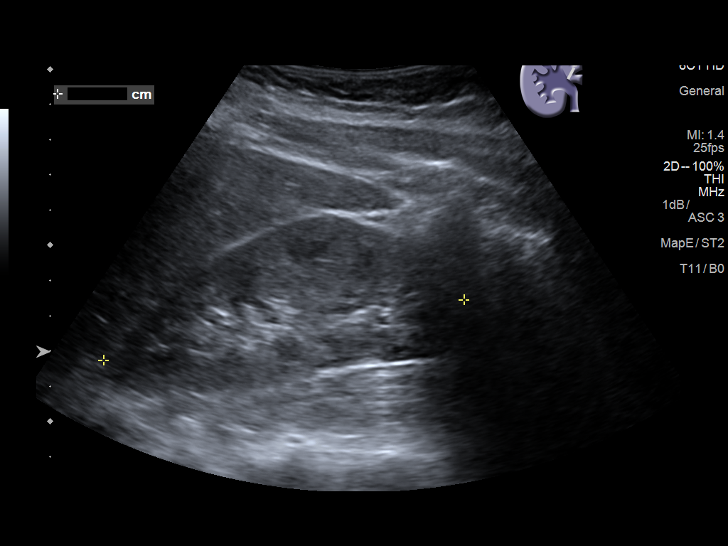
[im 31/83]
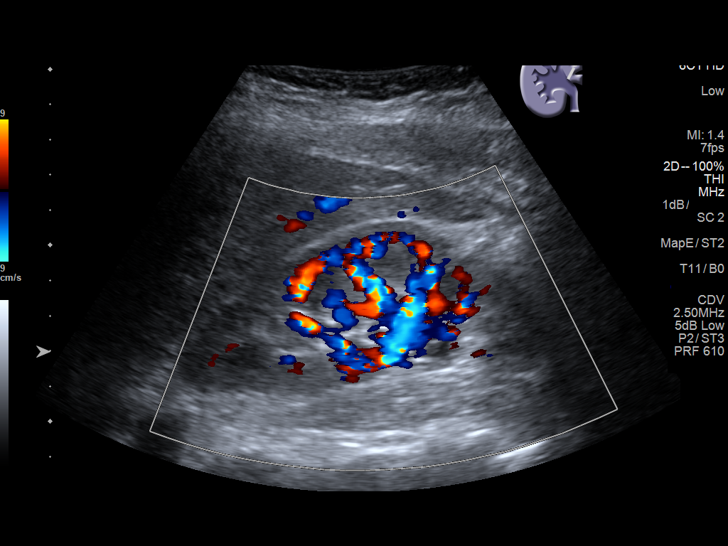
[im 38/83]
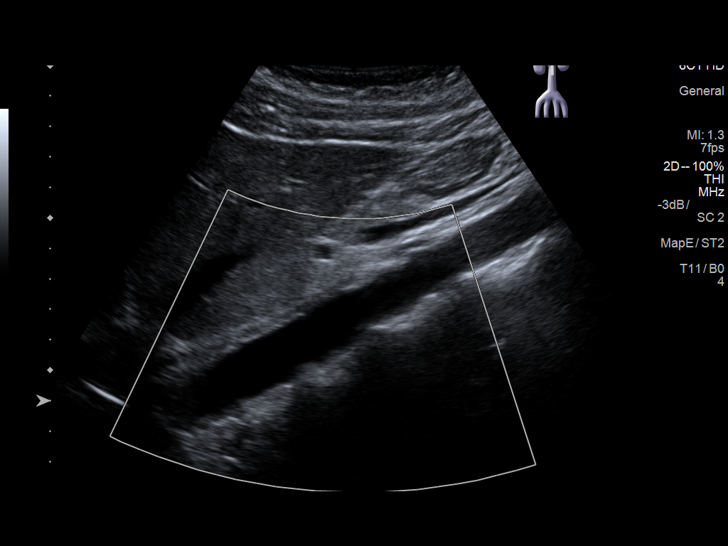
[im 45/83]
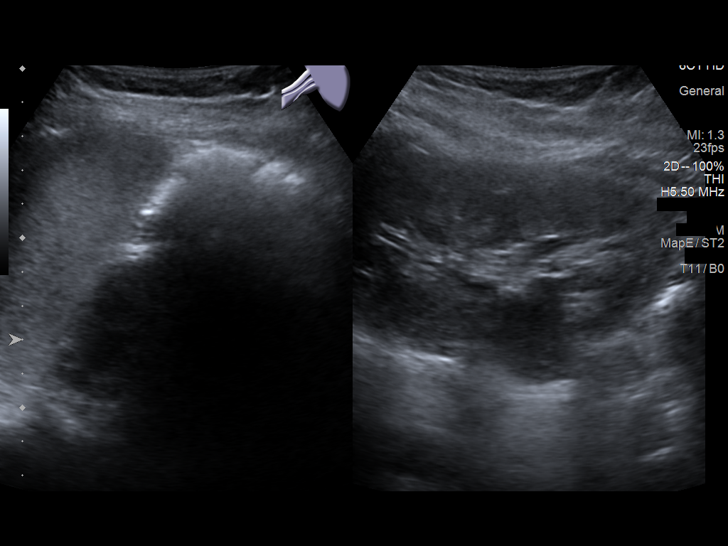
[im 52/83]
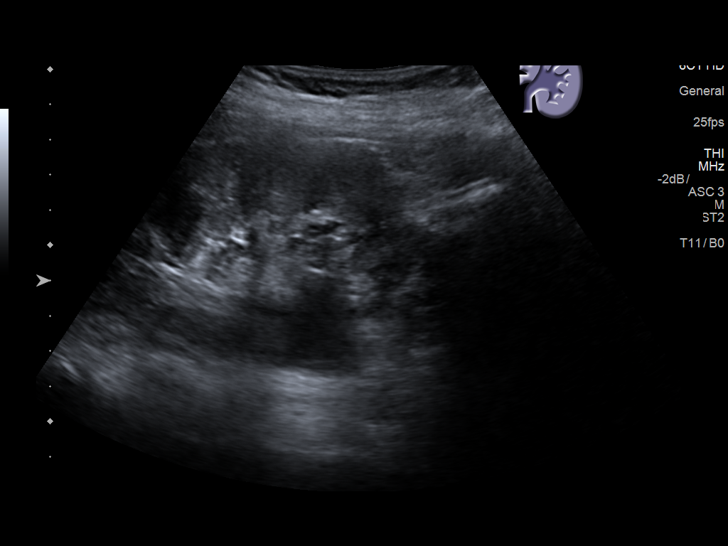
[im 55/83]
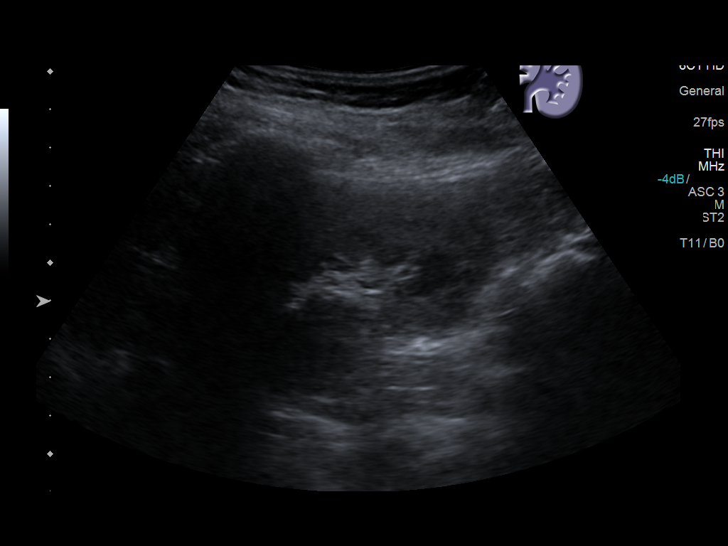
[im 62/83]
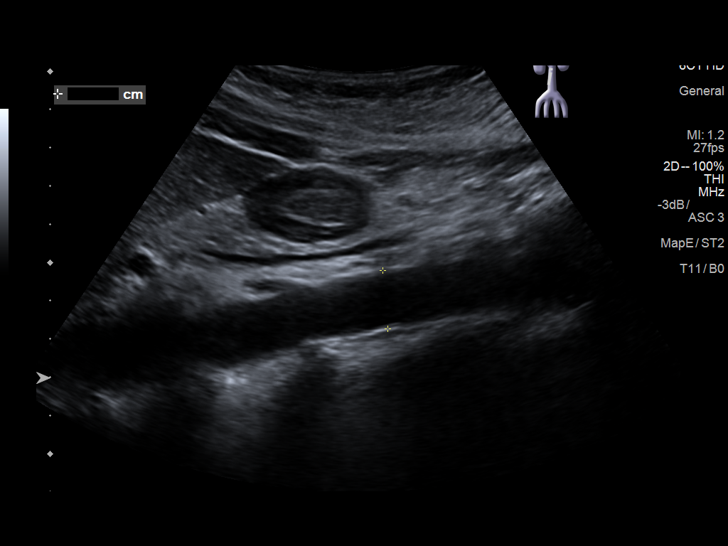
[im 69/83]
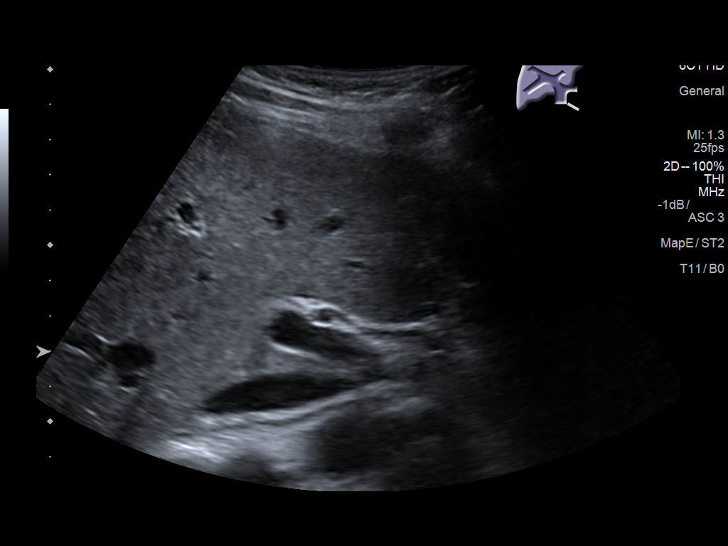
[im 76/83]
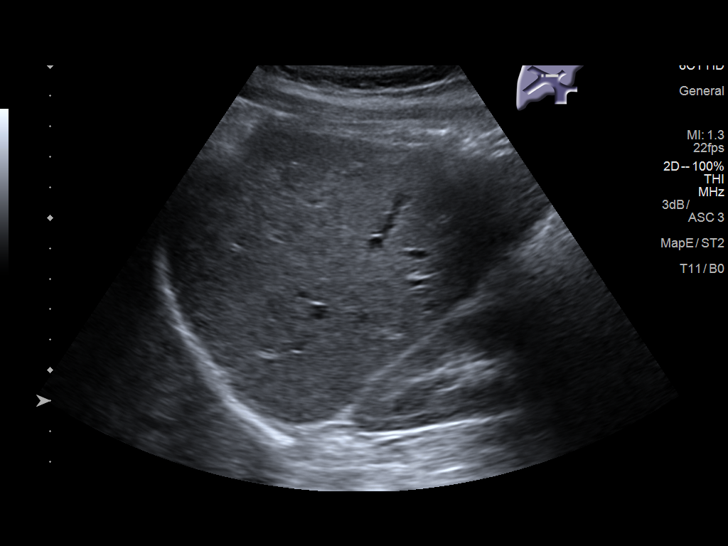
[im 83/83]
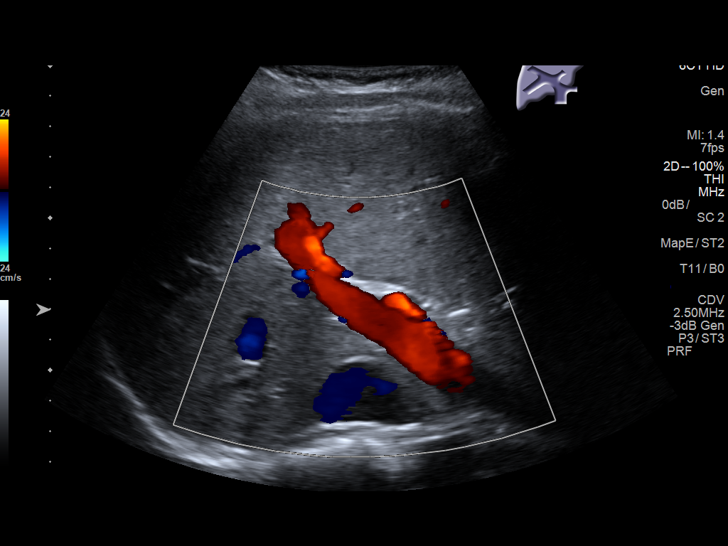

[14 of 25 positions shown; findings below may reference images not displayed]

FINDINGS: Gallbladder: No gallstones or wall thickening visualized. No
sonographic Murphy sign noted by sonographer.

Common bile duct: Diameter: 2.2 mm

Liver: No focal lesion identified. Within normal limits in
parenchymal echogenicity. Portal vein is patent on color Doppler
imaging with normal direction of blood flow towards the liver.

IVC: No abnormality visualized.

Pancreas: Visualized portion unremarkable.

Spleen: Size and appearance within normal limits.

Right Kidney: Length: 10.4 cm. Echogenicity within normal limits. No
mass or hydronephrosis visualized.

Left Kidney: Length: 11.4 cm. Echogenicity within normal limits. No
mass. Mild left hydronephrosis.

Abdominal aorta: No aneurysm visualized.

Other findings: None.
IMPRESSION: 1.  Mild left hydronephrosis.

2.  Exam otherwise unremarkable.

## 2020-01-15 ENCOUNTER — Encounter (INDEPENDENT_AMBULATORY_CARE_PROVIDER_SITE_OTHER): Payer: Self-pay | Admitting: Internal Medicine

## 2020-02-28 ENCOUNTER — Encounter (INDEPENDENT_AMBULATORY_CARE_PROVIDER_SITE_OTHER): Payer: Self-pay | Admitting: Internal Medicine

## 2020-02-29 ENCOUNTER — Other Ambulatory Visit (INDEPENDENT_AMBULATORY_CARE_PROVIDER_SITE_OTHER): Payer: Self-pay | Admitting: Internal Medicine

## 2020-02-29 DIAGNOSIS — E039 Hypothyroidism, unspecified: Secondary | ICD-10-CM

## 2020-02-29 DIAGNOSIS — E2839 Other primary ovarian failure: Secondary | ICD-10-CM

## 2020-03-03 LAB — ESTRADIOL: Estradiol: <15 pg/mL

## 2020-03-03 LAB — TSH: TSH: 0.03 mIU/L — ABNORMAL LOW

## 2020-03-03 LAB — PROGESTERONE: Progesterone: 19.3 ng/mL

## 2020-03-03 LAB — FOLLICLE STIMULATING HORMONE: FSH: 114.8 m[IU]/mL

## 2020-03-03 LAB — T3, FREE: T3, Free: 4.3 pg/mL — ABNORMAL HIGH (ref 2.3–4.2)

## 2020-03-15 ENCOUNTER — Ambulatory Visit (INDEPENDENT_AMBULATORY_CARE_PROVIDER_SITE_OTHER): Payer: BC Managed Care – PPO | Admitting: Internal Medicine

## 2020-03-21 ENCOUNTER — Ambulatory Visit (INDEPENDENT_AMBULATORY_CARE_PROVIDER_SITE_OTHER): Payer: 59 | Admitting: Internal Medicine

## 2020-03-21 ENCOUNTER — Other Ambulatory Visit: Payer: Self-pay

## 2020-03-21 ENCOUNTER — Encounter (INDEPENDENT_AMBULATORY_CARE_PROVIDER_SITE_OTHER): Payer: Self-pay | Admitting: Internal Medicine

## 2020-03-21 VITALS — BP 104/62 | HR 71 | Temp 97.5°F | Ht 65.0 in | Wt 158.2 lb

## 2020-03-21 DIAGNOSIS — E039 Hypothyroidism, unspecified: Secondary | ICD-10-CM

## 2020-03-21 DIAGNOSIS — F520 Hypoactive sexual desire disorder: Secondary | ICD-10-CM

## 2020-03-21 DIAGNOSIS — E2839 Other primary ovarian failure: Secondary | ICD-10-CM | POA: Diagnosis not present

## 2020-03-21 DIAGNOSIS — E559 Vitamin D deficiency, unspecified: Secondary | ICD-10-CM

## 2020-03-21 MED ORDER — NP THYROID 120 MG PO TABS
120.0000 mg | ORAL_TABLET | Freq: Every day | ORAL | 1 refills | Status: DC
Start: 2020-03-21 — End: 2023-05-22

## 2020-03-21 NOTE — Progress Notes (Signed)
Metrics: Intervention Frequency ACO  Documented Smoking Status Yearly  Screened one or more times in 24 months  Cessation Counseling or  Active cessation medication Past 24 months  Past 24 months   Guideline developer: UpToDate (See UpToDate for funding source) Date Released: 2014       Wellness Office Visit  Subjective:  Patient ID: Sabrina Nichols, female    DOB: 1970-03-15  Age: 50 y.o. MRN: [id number]  CC: This delightful lady comes in for follow-up of hypothyroidism, hypoactive sexual desire disorder, perimenopause and vitamin D deficiency. HPI Overall, she is doing well.  She had blood work done couple of weeks ago which showed that the [facility] is over 100 which puts her in the menopause.  However, she did have a menstrual cycle in November 2021.  She continues on progesterone, testosterone and NP thyroid and is not taking estradiol at the present time. She is concerned about some dependent edema that is not very bothersome but wanted to point this out to me. She also has some toenail problems which she has seen a podiatrist for and was wanting more information.  Past Medical History:  Diagnosis Date   Varicose veins    Vertigo    Past Surgical History:  Procedure Laterality Date   CESAREAN SECTION  Nov. 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   Social History   Tobacco Use   Smoking status: Never Smoker   Smokeless tobacco: Never Used  Substance Use Topics   Alcohol use: Yes    Comment: occ    Current Meds  Medication Sig   B-Complex CAPS Take 1 capsule by mouth daily.    Cholecalciferol (VITAMIN D3) 250 MCG (10000 UT) capsule Take 10,000 Units by mouth daily.    clotrimazole-betamethasone (LOTRISONE) cream Apply 1  application topically 2 (two) times daily.   Omega 3 1200 MG CAPS Take 1 capsule by mouth daily at 12 noon.    progesterone (PROMETRIUM) 200 MG capsule    Testosterone Propionate (FIRST-TESTOSTERONE MC) 2 % CREA Place 5 mg onto the skin daily.   [DISCONTINUED] estradiol (ESTRACE) 1 MG tablet Take 0.5 mg by mouth daily.    [DISCONTINUED] NP THYROID 120 MG tablet Take 1 tablet (120 mg total) by mouth daily before breakfast.     Flowsheet Row Office Visit from 03/21/2020 in [facility]  PHQ-9 Total Score 0      Objective:   Today's Vitals: BP 104/62    Pulse 71    Temp (!) 97.5 F (36.4 C) (Temporal)    Ht 5\' 5"  (1.651 m)    Wt 158 lb 3.2 oz (71.8 kg)    SpO2 99%    BMI 26.33 kg/m  Vitals with BMI 03/21/2020 11/30/2019 09/24/2019  Height 5\' 5"  5\' 5"  5\' 5"   Weight 158 lbs 3 oz 160 lbs 160 lbs 3 oz  BMI 26.33 26.63 26.66  Systolic 104 114 11/24/2019  Diastolic 62 62 70  Pulse 71 60 66     Physical Exam  She looks systemically well.  She has lost a couple pounds since last visit.  Blood pressure is excellent.  She has very minimal pitting edema in both her lower legs.  I think this is  dependent edema due to venous insufficiency.     Assessment   1. Hypothyroidism, adult   2. Primary ovarian failure   3. Hypoactive sexual desire disorder   4. Vitamin D deficiency disease       Tests ordered No orders of the defined types were placed in this encounter.    Plan: 1. She will continue her NP thyroid and I have refilled this medication today. 2. She will continue on testosterone and progesterone in regard to her perimenopausal state. 3. Continue on vitamin D3 supplementation for vitamin D deficiency. 4. She is not keen to start any medications for dependent edema and I tend to agree. 5. I will see her in August for an annual physical exam.   Meds ordered this encounter  Medications   NP THYROID 120 MG tablet    Sig: Take 1 tablet (120 mg total) by mouth daily  before breakfast.    Dispense:  90 tablet    Refill:  1    Brysen Shankman Normajean Glasgow, MD

## 2020-06-27 ENCOUNTER — Encounter (INDEPENDENT_AMBULATORY_CARE_PROVIDER_SITE_OTHER): Payer: Self-pay | Admitting: Internal Medicine

## 2020-06-28 ENCOUNTER — Telehealth (INDEPENDENT_AMBULATORY_CARE_PROVIDER_SITE_OTHER): Payer: Self-pay

## 2020-06-28 NOTE — Telephone Encounter (Signed)
Patients sister Dallas Breeding called and left an urgent message for you to return her call at [phone]. I see that Dr. Karilyn Cota sent you a MyChart message also. Thank you.

## 2020-06-28 NOTE — Telephone Encounter (Signed)
Someone please call this patient, I do not know what urgent means so we need to figure this out soon.

## 2020-06-28 NOTE — Telephone Encounter (Signed)
Sabrina Nichols has called this patient and documented on a different telephone message.

## 2020-07-04 NOTE — Telephone Encounter (Signed)
Close encounter 

## 2020-07-21 ENCOUNTER — Encounter (INDEPENDENT_AMBULATORY_CARE_PROVIDER_SITE_OTHER): Payer: Self-pay | Admitting: Internal Medicine

## 2020-07-27 ENCOUNTER — Ambulatory Visit: Payer: 59 | Admitting: Podiatry

## 2020-07-27 ENCOUNTER — Other Ambulatory Visit (INDEPENDENT_AMBULATORY_CARE_PROVIDER_SITE_OTHER): Payer: Self-pay | Admitting: Internal Medicine

## 2020-07-27 DIAGNOSIS — R5381 Other malaise: Secondary | ICD-10-CM

## 2020-07-27 DIAGNOSIS — E2839 Other primary ovarian failure: Secondary | ICD-10-CM

## 2020-07-27 DIAGNOSIS — R5383 Other fatigue: Secondary | ICD-10-CM

## 2020-07-27 DIAGNOSIS — F520 Hypoactive sexual desire disorder: Secondary | ICD-10-CM

## 2020-07-27 DIAGNOSIS — E039 Hypothyroidism, unspecified: Secondary | ICD-10-CM

## 2020-07-30 LAB — T4, FREE: Free T4: 1 ng/dL (ref 0.8–1.8)

## 2020-07-30 LAB — DHEA-SULFATE: DHEA-SO4: 152 ug/dL (ref 15–205)

## 2020-07-30 LAB — ESTRADIOL: Estradiol: 46 pg/mL

## 2020-07-30 LAB — TESTOS,TOTAL,FREE AND SHBG (FEMALE)
Free Testosterone: 1.3 pg/mL (ref 0.1–6.4)
Sex Hormone Binding: 150 nmol/L — ABNORMAL HIGH (ref 17–124)
Testosterone, Total, LC-MS-MS: 28 ng/dL (ref 2–45)

## 2020-07-30 LAB — T3, FREE: T3, Free: 3.8 pg/mL (ref 2.3–4.2)

## 2020-07-30 LAB — TSH: TSH: 0.13 mIU/L — ABNORMAL LOW

## 2020-07-30 LAB — PROGESTERONE: Progesterone: 26 ng/mL

## 2020-08-03 ENCOUNTER — Ambulatory Visit (INDEPENDENT_AMBULATORY_CARE_PROVIDER_SITE_OTHER): Payer: 59 | Admitting: Internal Medicine

## 2020-08-03 ENCOUNTER — Other Ambulatory Visit: Payer: Self-pay

## 2020-08-03 ENCOUNTER — Encounter (INDEPENDENT_AMBULATORY_CARE_PROVIDER_SITE_OTHER): Payer: Self-pay | Admitting: Internal Medicine

## 2020-08-03 VITALS — BP 98/64 | HR 65 | Temp 97.6°F | Ht 65.0 in | Wt 156.4 lb

## 2020-08-03 DIAGNOSIS — F419 Anxiety disorder, unspecified: Secondary | ICD-10-CM

## 2020-08-03 DIAGNOSIS — E039 Hypothyroidism, unspecified: Secondary | ICD-10-CM

## 2020-08-03 DIAGNOSIS — F520 Hypoactive sexual desire disorder: Secondary | ICD-10-CM

## 2020-08-03 DIAGNOSIS — E2839 Other primary ovarian failure: Secondary | ICD-10-CM

## 2020-08-03 MED ORDER — ESCITALOPRAM OXALATE 5 MG PO TABS: 5.0000 mg | ORAL_TABLET | Freq: Two times a day (BID) | ORAL | 3 refills | Status: DC

## 2020-08-03 NOTE — Progress Notes (Signed)
Metrics: Intervention Frequency ACO  Documented Smoking Status Yearly  Screened one or more times in 24 months  Cessation Counseling or  Active cessation medication Past 24 months  Past 24 months   Guideline developer: UpToDate (See UpToDate for funding source) Date Released: 2014       Wellness Office Visit  Subjective:  Patient ID: Sabrina Nichols, female    DOB: [birth year]  Age: 50 y.o. MRN: [id number]  CC: This lady comes in for follow-up of hypothyroidism, testosterone therapy and perimenopausal state. HPI  Since May of this year, she has been more anxious and irritable because her ex-husband has more time with her children and the children are unhappy with the ex-husband most of the times.  She is clearly not suicidal.  She is finding this difficult to cope with.  On top of this, the ex-husband appears to be trying to make her own life difficult. She continues on NP thyroid, progesterone and testosterone. I reviewed all her blood work with her.  Her testosterone levels are clearly suboptimal and I am not sure why.  She tells me she took the testosterone 1 click the day of the blood test.  This is her normal dose. Past Medical History:  Diagnosis Date   Varicose veins    Vertigo    Past Surgical History:  Procedure Laterality Date   CESAREAN SECTION  Nov. 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   Social History   Tobacco Use   Smoking status: Never   Smokeless tobacco: Never  Substance Use Topics   Alcohol use: Yes    Comment: occ    Current Meds  Medication Sig   Cholecalciferol (VITAMIN D3) 250 MCG (10000 UT) capsule Take 10,000 Units by mouth daily.    clotrimazole-betamethasone (LOTRISONE) cream Apply 1 application topically 2 (two)  times daily.   escitalopram (LEXAPRO) 5 MG tablet Take 1 tablet (5 mg total) by mouth 2 (two) times daily.   NP THYROID 120 MG tablet Take 1 tablet (120 mg total) by mouth daily before breakfast.   progesterone (PROMETRIUM) 200 MG capsule    Testosterone Propionate (FIRST-TESTOSTERONE MC) 2 % CREA Place 5 mg onto the skin daily.   [DISCONTINUED] Omega 3 1200 MG CAPS Take 1 capsule by mouth daily at 12 noon.      Flowsheet Row Office Visit from 03/21/2020 in [facility]  PHQ-9 Total Score 0       Objective:   Today's Vitals: BP 98/64   Pulse 65   Temp 97.6 F (36.4 C) (Temporal)   Ht 5\' 5"  (1.651 m)   Wt 156 lb 6.4 oz (70.9 kg)   SpO2 98%   BMI 26.03 kg/m  Vitals with BMI 08/03/2020 03/21/2020 11/30/2019  Height 5\' 5"  5\' 5"  5\' 5"   Weight 156 lbs 6 oz 158 lbs 3 oz 160 lbs  BMI 26.03 26.33 26.63  Systolic 98 104 114  Diastolic 64 62 62  Pulse 65 71 60     Physical Exam  She is tearful.  Vital signs are stable.  Weight is stable.     Assessment   1. Hypoactive sexual desire disorder   2. Hypothyroidism, adult   3. Primary ovarian failure   4.  Anxiety       Tests ordered No orders of the defined types were placed in this encounter.    Plan: 1.  Continue with the same dose of NP thyroid for now. 2.  She can increase the testosterone dose to 2 clicks daily. 3.  In terms of her anxiety/irritability and probable depression, I am going to try her on Lexapro 5 mg daily and she can increase it to 10 mg daily depending on how she feels after a couple of weeks. 4.  Follow-up as previously scheduled in September for an annual physical exam.    Meds ordered this encounter  Medications   escitalopram (LEXAPRO) 5 MG tablet    Sig: Take 1 tablet (5 mg total) by mouth 2 (two) times daily.    Dispense:  60 tablet    Refill:  3     Chazlyn Cude Normajean Glasgow, MD

## 2020-08-31 ENCOUNTER — Encounter (INDEPENDENT_AMBULATORY_CARE_PROVIDER_SITE_OTHER): Payer: 59 | Admitting: Internal Medicine

## 2020-09-12 ENCOUNTER — Ambulatory Visit: Payer: 59 | Admitting: Podiatry

## 2020-09-27 ENCOUNTER — Encounter (INDEPENDENT_AMBULATORY_CARE_PROVIDER_SITE_OTHER): Payer: 59 | Admitting: Internal Medicine

## 2020-10-13 ENCOUNTER — Other Ambulatory Visit: Payer: Self-pay

## 2020-10-13 ENCOUNTER — Ambulatory Visit (INDEPENDENT_AMBULATORY_CARE_PROVIDER_SITE_OTHER): Payer: 59 | Admitting: Psychiatry

## 2020-10-13 ENCOUNTER — Encounter: Payer: Self-pay | Admitting: Psychiatry

## 2020-10-13 DIAGNOSIS — F411 Generalized anxiety disorder: Secondary | ICD-10-CM | POA: Diagnosis not present
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
## 2020-10-18 ENCOUNTER — Other Ambulatory Visit: Payer: Self-pay

## 2020-10-18 ENCOUNTER — Ambulatory Visit (INDEPENDENT_AMBULATORY_CARE_PROVIDER_SITE_OTHER): Payer: 59 | Admitting: Psychiatry

## 2020-10-18 DIAGNOSIS — F411 Generalized anxiety disorder: Secondary | ICD-10-CM
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
## 2020-10-25 ENCOUNTER — Ambulatory Visit (INDEPENDENT_AMBULATORY_CARE_PROVIDER_SITE_OTHER): Payer: 59 | Admitting: Psychiatry

## 2020-10-25 ENCOUNTER — Other Ambulatory Visit: Payer: Self-pay

## 2020-10-25 DIAGNOSIS — F411 Generalized anxiety disorder: Secondary | ICD-10-CM | POA: Diagnosis not present
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
## 2020-11-01 ENCOUNTER — Other Ambulatory Visit: Payer: Self-pay

## 2020-11-01 ENCOUNTER — Ambulatory Visit (INDEPENDENT_AMBULATORY_CARE_PROVIDER_SITE_OTHER): Payer: 59 | Admitting: Psychiatry

## 2020-11-01 DIAGNOSIS — F411 Generalized anxiety disorder: Secondary | ICD-10-CM | POA: Diagnosis not present

## 2020-11-01 NOTE — Progress Notes (Signed)
      [facility] Counselor/Therapist Progress Note  Patient ID: Sabrina Nichols, MRN: [id number],    Date: 11/01/2020  Time Spent: 50 minutes start time 10:10 AM end time 11 AM  Treatment Type: Individual Therapy  Reported Symptoms: anxiety, sadness  Mental Status Exam:  Appearance:   Well Groomed     Behavior:  Appropriate  Motor:  Normal  Speech/Language:   Normal Rate  Affect:  Appropriate  Mood:  anxious  Thought process:  normal  Thought content:    WNL  Sensory/Perceptual disturbances:    WNL  Orientation:  oriented to person, place, time/date, and situation  Attention:  Good  Concentration:  Good  Memory:  WNL  Fund of knowledge:   Good  Insight:    Good  Judgment:   Good  Impulse Control:  Good   Risk Assessment: Danger to Self:  No Self-injurious Behavior: No Danger to Others: No Duty to Warn:no Physical Aggression / Violence:No  Access to Firearms a concern: No  Gang Involvement:No   Subjective: Patient was present for session. She shared that things are still stressful with the situation with her ex.  She shared that her biggest concern is her daughters.  She shared that when she knows they are hurting and upset it is very troubling to her and she feels very powerless to be able to help them in any way.  Patient shared that she knows how they feel when they interact with her father because she had similar emotions but does not want to say anything that would impact their relationship so she is not always sure how to handle the situation appropriately.  Patient was encouraged to affirm their feelings and to focus on helping them release of negative emotions as well as herself.  Discussed different strategies that she can do to do that as well as taught her thought stopping technique ST OP P.  Encouraged patient to focus on the things that she can control fix and change and recognizing as she is keeping herself in a good place it would be easier for her just to love  her children and be there to support them.    Interventions: Cognitive Behavioral Therapy, Solution-Oriented/Positive Psychology, and Insight-Oriented  Diagnosis:   ICD-10-CM   1. Generalized anxiety disorder  F41.1       Plan: Patient is to use CBT and coping skills to decrease anxiety symptoms.  Patient is to follow plans and suggestions written out on an index card and given to her.  Patient is to work in yoga and other exercise to release negative emotions appropriately.  Patient is to work on self-care so that she can be supportive of her daughters. Long-term goal: Resolve the core conflict that is a source of anxiety.  Decrease triggered responses that occur when having to deal with her ex-husband by 50% Short-term goal: Identify the major life complex in the past and present the form the basis for present anxiety.  Stevphen Meuse, [facility]

## 2020-11-17 ENCOUNTER — Other Ambulatory Visit: Payer: Self-pay

## 2020-11-17 ENCOUNTER — Ambulatory Visit (INDEPENDENT_AMBULATORY_CARE_PROVIDER_SITE_OTHER): Payer: 59 | Admitting: Psychiatry

## 2020-11-17 DIAGNOSIS — F411 Generalized anxiety disorder: Secondary | ICD-10-CM
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
## 2020-11-30 ENCOUNTER — Other Ambulatory Visit: Payer: Self-pay

## 2020-11-30 ENCOUNTER — Ambulatory Visit (INDEPENDENT_AMBULATORY_CARE_PROVIDER_SITE_OTHER): Payer: 59 | Admitting: Psychiatry

## 2020-11-30 DIAGNOSIS — F411 Generalized anxiety disorder: Secondary | ICD-10-CM | POA: Diagnosis not present

## 2020-11-30 NOTE — Progress Notes (Signed)
      [facility] Counselor/Therapist Progress Note  Patient ID: Sabrina Nichols, MRN: [id number],    Date: 11/30/2020  Time Spent: 51 minutes start time 2:05 PM end time 2:56 PM  Treatment Type: Individual Therapy  Reported Symptoms: anxiety, sadness  Mental Status Exam:  Appearance:   Well Groomed     Behavior:  Appropriate  Motor:  Normal  Speech/Language:   Normal Rate  Affect:  Appropriate  Mood:  normal  Thought process:  normal  Thought content:    WNL  Sensory/Perceptual disturbances:    WNL  Orientation:  oriented to person, place, time/date, and situation  Attention:  Good  Concentration:  Good  Memory:  WNL  Fund of knowledge:   Good  Insight:    Good  Judgment:   Good  Impulse Control:  Good   Risk Assessment: Danger to Self:  No Self-injurious Behavior: No Danger to Others: No Duty to Warn:no Physical Aggression / Violence:No  Access to Firearms a concern: No  Gang Involvement:No   Subjective: Patient was present for session. She shared that she has noticed positive changes after her EMDR session.  She went on to share she is feeling better about her ex but feeling anxiety about her new relationship.  Did processing set on him canceling plans, suds level 7, negative cognition "I am not important" felt anxiety and disappointment in her heart.  Patient was able to reduce suds level to 2.  She was able to recognize that she is fine and that she just needs to continue taking things slow and taking care of herself.  Ways to do that were discussed in session and plans were developed.  Interventions: Solution-Oriented/Positive Psychology and Eye Movement Desensitization and Reprocessing (EMDR)  Diagnosis:   ICD-10-CM   1. Generalized anxiety disorder  F41.1       Plan: Patient is to use CBT and coping skills to decrease anxiety symptoms.  Patient is to continue releasing negative emotions through exercise.  Patient is to remind herself that she is  enough. Long-term goal: Resolve the core conflict that is a source of anxiety.  Decrease triggered responses that occur when having to deal with her ex-husband by 50% Short-term goal: Identify the major life complex in the past and present the form the basis for present anxiety.  Stevphen Meuse, [facility]

## 2020-12-01 ENCOUNTER — Ambulatory Visit: Payer: 59 | Admitting: Psychiatry

## 2020-12-06 ENCOUNTER — Ambulatory Visit (INDEPENDENT_AMBULATORY_CARE_PROVIDER_SITE_OTHER): Payer: 59 | Admitting: Psychiatry

## 2020-12-06 ENCOUNTER — Other Ambulatory Visit: Payer: Self-pay

## 2020-12-06 DIAGNOSIS — F411 Generalized anxiety disorder: Secondary | ICD-10-CM

## 2020-12-06 NOTE — Progress Notes (Signed)
      [facility] Counselor/Therapist Progress Note  Patient ID: Sabrina Nichols, MRN: [id number],    Date: 12/06/2020  Time Spent: 52 minutes start time 8:08 AM end time 9:00 AM  Treatment Type: Individual Therapy  Reported Symptoms: anxiety  Mental Status Exam:  Appearance:   Casual and Neat     Behavior:  Appropriate  Motor:  Normal  Speech/Language:   Normal Rate  Affect:  Appropriate  Mood:  anxious  Thought process:  normal  Thought content:    WNL  Sensory/Perceptual disturbances:    WNL  Orientation:  oriented to person, place, time/date, and situation  Attention:  Good  Concentration:  Good  Memory:  WNL  Fund of knowledge:   Good  Insight:    Good  Judgment:   Good  Impulse Control:  Good   Risk Assessment: Danger to Self:  No Self-injurious Behavior: No Danger to Others: No Duty to Warn:no Physical Aggression / Violence:No  Access to Firearms a concern: No  Gang Involvement:No   Subjective: Patient was present for session.  She shared that things are still stressful with her ex and his relationship with the daughters.  Patient shared that she felt issues with her dad may have led to her marrying her ex.  Processed dad's comments about the restaurant, suds level 7, negative cognition "I am not good enough" felt sadness and anxiety in her chest.  Patient was able to reduce suds level to 4.  Encourage patient to continue allowing processing to take place over the next few weeks to address further at next session  Interventions: Solution-Oriented/Positive Psychology and Eye Movement Desensitization and Reprocessing (EMDR)  Diagnosis:   ICD-10-CM   1. Generalized anxiety disorder  F41.1       Plan: Patient is to use coping skills to decrease anxiety symptoms.  Patient is to allow processing through journaling over the next few weeks.  Patient is to continue exercising to release negative emotions appropriately. Long-term goal: Resolve the core conflict that is a  source of anxiety.  Decrease triggered responses that occur when having to deal with her ex-husband by 50% Short-term goal: Identify the major life complex in the past and present the form the basis for present anxiety.  Stevphen Meuse, [facility]

## 2020-12-13 ENCOUNTER — Other Ambulatory Visit: Payer: Self-pay

## 2020-12-13 ENCOUNTER — Ambulatory Visit (INDEPENDENT_AMBULATORY_CARE_PROVIDER_SITE_OTHER): Payer: 59 | Admitting: Psychiatry

## 2020-12-13 DIAGNOSIS — F411 Generalized anxiety disorder: Secondary | ICD-10-CM

## 2020-12-13 NOTE — Progress Notes (Signed)
      [facility] Counselor/Therapist Progress Note  Patient ID: Sabrina Nichols, MRN: [id number],    Date: 12/13/2020  Time Spent: 54 minutes start time 11:04 AM end time 11:58 AM  Treatment Type: Individual Therapy  Reported Symptoms: anxiety, sadness  Mental Status Exam:  Appearance:   Well Groomed     Behavior:  Appropriate  Motor:  Normal  Speech/Language:   Normal Rate  Affect:  Appropriate  Mood:  normal  Thought process:  normal  Thought content:    WNL  Sensory/Perceptual disturbances:    WNL  Orientation:  oriented to person, place, time/date, and situation  Attention:  Good  Concentration:  Good  Memory:  WNL  Fund of knowledge:   Good  Insight:    Good  Judgment:   Good  Impulse Control:  Good   Risk Assessment: Danger to Self:  No Self-injurious Behavior: No Danger to Others: No Duty to Warn:no Physical Aggression / Violence:No  Access to Firearms a concern: No  Gang Involvement:No   Subjective: Patient was present for session. She shared that things continue to be changing with her ex. she went on to report that overall she is feeling a decrease in her anxiety and that is progress.  She feels that the processing that is going on in sessions has been helpful.  Did processing set up on statements from her dad that were very disturbing, suds level 8, negative cognition "it is my fault" felt guilt in her stomach.  Patient was able to reduce suds level to 4.  She was able to recognize different situations where she had felt uncertain and looking back on them and she realized she was enough and need to work on her affirmations.  Encouraged her to find some things that she could do to continue releasing negative emotions appropriately and also empower herself.  Patient agreed to consider taking a course that would help her feel her skills were more competent at work.  Interventions: Solution-Oriented/Positive Psychology, Eye Movement Desensitization and Reprocessing  (EMDR), and Insight-Oriented  Diagnosis:   ICD-10-CM   1. Generalized anxiety disorder  F41.1       Plan: Patient is to use CBT and coping skills to continue decreasing negative emotions.  Patient is to exercise to release negative emotions appropriately.  Patient is to consider taking a course to improve her skills in one area of her job.  Patient is to continue working on affirming herself regularly. Long-term goal: Resolve the core conflict that is a source of anxiety.  Decrease triggered responses that occur when having to deal with her ex-husband by 50% Short-term goal: Identify the major life complex in the past and present the form the basis for present anxiety.    Stevphen Meuse, [facility]

## 2020-12-14 ENCOUNTER — Ambulatory Visit (INDEPENDENT_AMBULATORY_CARE_PROVIDER_SITE_OTHER): Payer: 59 | Admitting: Orthopaedic Surgery

## 2020-12-14 ENCOUNTER — Ambulatory Visit (INDEPENDENT_AMBULATORY_CARE_PROVIDER_SITE_OTHER): Payer: 59

## 2020-12-14 ENCOUNTER — Encounter: Payer: Self-pay | Admitting: Orthopaedic Surgery

## 2020-12-14 DIAGNOSIS — M25572 Pain in left ankle and joints of left foot: Secondary | ICD-10-CM

## 2020-12-14 NOTE — Progress Notes (Signed)
Office Visit Note   Patient: Sabrina Nichols           Date of Birth: January 02, 1971           MRN: [id number] Visit Date: 12/14/2020              Requested by: Gweneth Dimitri, MD [address] [place],  [place] [address] PCP: Gweneth Dimitri, MD   Assessment & Plan: Visit Diagnoses:  1. Pain in left ankle and joints of left foot     Plan: Impression is left foot/ankle pain likely from peroneal tendonitis.  She is not symptomatic enough to warrant immobilization.  Recommend rest, NSAIDs, voltaren gel, NTG patch.  She will follow up with [place] as needed.    Follow-Up Instructions: Return if symptoms worsen or fail to improve.   Orders:  Orders Placed This Encounter  Procedures   XR Ankle Complete Left   XR Foot Complete Left   No orders of the defined types were placed in this encounter.     Procedures: No procedures performed   Clinical Data: No additional findings.   Subjective: Chief Complaint  Patient presents with   Left Ankle - Pain   Left Foot - Pain    HPI patient is a pleasant 50 year old female who comes in today with left lateral foot pain for the past few months.  She denies any injury but does note that she was playing beach volleyball this past fall.  The pain she has is to the lateral foot and occasionally radiates to the lateral ankle.  Pain is worse when she is standing while riding the peloton and moving her foot a certain way.  She takes an occasional ibuprofen which minimally helps.  Review of Systems as detailed in HPI.  All other reviewed and are negative.   Objective: Vital Signs: There were no vitals taken for this visit.  Physical Exam well-developed well-nourished female no acute distress.  Alert and oriented x3.  Ortho Exam left foot exam shows mild to moderate tenderness over the base of the fifth metatarsal.  No tenderness along the peroneal tendon.  She has slight pain with plantar flexion of the ankle.  She is neurovascular intact  distally.  Specialty Comments:  No specialty comments available.  Imaging: XR Ankle Complete Left  Result Date: 12/14/2020 X-rays demonstrate os trigonum.  No other acute findings.  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 2009   laproscopic surgery to remove IUD  March 2010   Social History   Occupational History   Not on file  Tobacco Use   Smoking status: Never   Smokeless tobacco: Never  Vaping Use   Vaping Use: Never used  Substance and Sexual Activity   Alcohol use: Yes    Comment: occ   Drug use: No   Sexual activity: Yes

## 2020-12-20 ENCOUNTER — Ambulatory Visit (INDEPENDENT_AMBULATORY_CARE_PROVIDER_SITE_OTHER): Payer: 59 | Admitting: Psychiatry

## 2020-12-20 ENCOUNTER — Other Ambulatory Visit: Payer: Self-pay

## 2020-12-20 DIAGNOSIS — F411 Generalized anxiety disorder: Secondary | ICD-10-CM

## 2020-12-20 NOTE — Progress Notes (Signed)
      [facility] Counselor/Therapist Progress Note  Patient ID: Sabrina Nichols, MRN: [id number],    Date: 12/20/2020  Time Spent: 51 minutes start time 8:07 AM end time 8:58 AM  Treatment Type: Individual Therapy  Reported Symptoms: anxiety, sadness, insecurity  Mental Status Exam:  Appearance:   Casual and Neat     Behavior:  Appropriate  Motor:  Normal  Speech/Language:   Normal Rate  Affect:  Appropriate  Mood:  normal  Thought process:  normal  Thought content:    WNL  Sensory/Perceptual disturbances:    WNL  Orientation:  oriented to person, place, time/date, and situation  Attention:  Good  Concentration:  Good  Memory:  WNL  Fund of knowledge:   Good  Insight:    Good  Judgment:   Good  Impulse Control:  Good   Risk Assessment: Danger to Self:  No Self-injurious Behavior: No Danger to Others: No Duty to Warn:no Physical Aggression / Violence:No  Access to Firearms a concern: No  Gang Involvement:No   Subjective: Patient was present for session.  She shared that her trip to [place] with her girls was good.  Patient reported she feels like she is handling most of the anxiety concerning her ex-husband appropriately and that she is just waiting for things to progress.  Patient shared that she has noticed some emotions surfaced with some of the comments that her daughters made to her, decided to process that issue suds level 8, negative cognition "I am not good enough" felt insecurity and anxiety in her chest.  Patient was able to reduce suds level to 5.  She was able to recognize that some of the issues are stemming from earlier times in her life and she worked on those parts of her.  Patient was encouraged to continue working on her self-care and affirming herself regularly.  Ways to discuss talk with her daughter about the way she states things were also discussed with patient.  Interventions: Cognitive Behavioral Therapy, Solution-Oriented/Positive Psychology, Eye Movement  Desensitization and Reprocessing (EMDR), and Insight-Oriented  Diagnosis:   ICD-10-CM   1. Generalized anxiety disorder  F41.1       Plan: Patient is to use CBT and coping skills to decrease anxiety symptoms.  Patient is to continue working on affirming younger parts of her regularly.  Patient is to exercise to release negative emotions appropriately.  Patient is to follow plans to communicate with her daughter. Long-term goal: Resolve the core conflict that is a source of anxiety.  Decrease triggered responses that occur when having to deal with her ex-husband by 50% Short-term goal: Identify the major life complex in the past and present the form the basis for present anxiety.  Stevphen Meuse, [facility]

## 2020-12-27 ENCOUNTER — Ambulatory Visit: Payer: 59 | Admitting: Psychiatry

## 2021-01-03 ENCOUNTER — Ambulatory Visit: Payer: 59 | Admitting: Psychiatry

## 2021-01-12 ENCOUNTER — Ambulatory Visit (INDEPENDENT_AMBULATORY_CARE_PROVIDER_SITE_OTHER): Payer: 59 | Admitting: Psychiatry

## 2021-01-12 ENCOUNTER — Other Ambulatory Visit: Payer: Self-pay

## 2021-01-12 DIAGNOSIS — F411 Generalized anxiety disorder: Secondary | ICD-10-CM | POA: Diagnosis not present

## 2021-01-12 NOTE — Progress Notes (Signed)
°      [facility] Counselor/Therapist Progress Note  Patient ID: Sabrina Nichols, MRN: [id number],    Date: 01/12/2021  Time Spent: 56 minutes.  Start time 5:01 PM end time 5:57 PM  Treatment Type: Individual Therapy  Reported Symptoms: Anxiety, sadness  Mental Status Exam:  Appearance:   Well Groomed     Behavior:  Appropriate  Motor:  Normal  Speech/Language:   Normal Rate  Affect:  Appropriate  Mood:  anxious  Thought process:  normal  Thought content:    WNL  Sensory/Perceptual disturbances:    WNL  Orientation:  oriented to person, place, time/date, and situation  Attention:  Good  Concentration:  Good  Memory:  WNL  Fund of knowledge:   Good  Insight:    Good  Judgment:   Good  Impulse Control:  Good   Risk Assessment: Danger to Self:  No Self-injurious Behavior: No Danger to Others: No Duty to Warn:no Physical Aggression / Violence:No  Access to Firearms a concern: No  Gang Involvement:No   Subjective: Patient was worked in for a session.  She shared that her kids have started family therapy with their dad.  She went on to share that she feels the EMDR on him was helpful and she was not getting as triggered and it was helping to decrease her anxiety.  She is however starting to date and wanting to make sure she does not focus on too much anxiety.  Did EMDR set on him not responding quickly to a message, suds level 8, negative cognition "I am not important enough" felt anxiety and sadness in her chest and throat.  Patient was able to reduce suds level to 4.  She reported feeling much better at the end of session.  Discussed some ways that she can reassure herself and the importance of working on self talk.  Interventions: Solution-Oriented/Positive Psychology, Eye Movement Desensitization and Reprocessing (EMDR), and Insight-Oriented  Diagnosis:   ICD-10-CM   1. Generalized anxiety disorder  F41.1       Plan: Patient is to use CBT and coping skills to decrease  anxiety symptoms.  Patient is to work on exercising to release her anxiety appropriately.  Patient is to follow plans from session and affirm herself of positives. Long-term goal: Resolve the core conflict that is a source of anxiety.  Decrease triggered responses that occur when having to deal with her ex-husband by 50% Short-term goal: Identify the major life complex in the past and present the form the basis for present anxiety.  Stevphen Meuse, [facility]

## 2021-01-26 ENCOUNTER — Other Ambulatory Visit: Payer: Self-pay

## 2021-01-26 ENCOUNTER — Ambulatory Visit (INDEPENDENT_AMBULATORY_CARE_PROVIDER_SITE_OTHER): Payer: 59 | Admitting: Psychiatry

## 2021-01-26 DIAGNOSIS — F411 Generalized anxiety disorder: Secondary | ICD-10-CM | POA: Diagnosis not present

## 2021-01-26 NOTE — Progress Notes (Signed)
°      [facility] Counselor/Therapist Progress Note  Patient ID: Sabrina Nichols, MRN: [id number],    Date: 01/26/2021  Time Spent: 50 minutes start time 12:02 PM end time 12:52 PM  Treatment Type: Individual Therapy  Reported Symptoms: anxiety, sadness, triggered responses.  Mental Status Exam:  Appearance:   Casual and Neat     Behavior:  Appropriate  Motor:  Normal  Speech/Language:   Normal Rate  Affect:  Appropriate  Mood:  normal  Thought process:  normal  Thought content:    WNL  Sensory/Perceptual disturbances:    WNL  Orientation:  oriented to person, place, time/date, and situation  Attention:  Good  Concentration:  Good  Memory:  WNL  Fund of knowledge:   Good  Insight:    Good  Judgment:   Good  Impulse Control:  Good   Risk Assessment: Danger to Self:  No Self-injurious Behavior: No Danger to Others: No Duty to Warn:no Physical Aggression / Violence:No  Access to Firearms a concern: No  Gang Involvement:No   Subjective: Patient was present for session.  She explained that she was feeling much better about the situation from last session.  She is feeling anxiety about the upcoming visits with her ex, the children and therapist.  She explained that he had recommended that she attend the session and she has not heard back from the clinician to confirm it.  Encouraged patient to go ahead and go to the session and allow the clinician to tell her how she needs to proceed.  Patient was also encouraged to remind herself that she just has to do adhere to the court order and be prepared to handle what ever situation arises with her children.  Patient shared that her mother and her father are having some difficulties as well.  Discussed the importance of her not getting triangulated in the situation which has been pattern in the past.  Patient was encouraged to work on taking care of herself and releasing negative emotions appropriately.  Interventions: Cognitive Behavioral  Therapy, Solution-Oriented/Positive Psychology, and Insight-Oriented  Diagnosis:   ICD-10-CM   1. Generalized anxiety disorder  F41.1       Plan: Patient is to use CBT and coping skills to decrease anxiety symptoms.  Patient is to follow plans from session to deal with issues addressed in session including her acts and her parents.  Patient is to exercise to release negative emotions appropriately.  Patient is to see medical providers for other health issues. Long-term goal: Resolve the core conflict that is a source of anxiety.  Decrease triggered responses that occur when having to deal with her ex-husband by 50% Short-term goal: Identify the major life complex in the past and present the form the basis for present anxiety  Stevphen Meuse, [facility]

## 2021-02-13 ENCOUNTER — Other Ambulatory Visit: Payer: Self-pay

## 2021-02-13 ENCOUNTER — Ambulatory Visit (INDEPENDENT_AMBULATORY_CARE_PROVIDER_SITE_OTHER): Payer: 59 | Admitting: Psychiatry

## 2021-02-13 DIAGNOSIS — F411 Generalized anxiety disorder: Secondary | ICD-10-CM | POA: Diagnosis not present

## 2021-02-13 NOTE — Progress Notes (Signed)
°      [facility] Counselor/Therapist Progress Note  Patient ID: Jeanny Rymer, MRN: [id number],    Date: 02/13/2021  Time Spent: 50 minutes start time 2:04 PM end time 2:54 PM  Treatment Type: Individual Therapy  Reported Symptoms: anxiety, triggered responses, sadness  Mental Status Exam:  Appearance:   Casual and Neat     Behavior:  Appropriate  Motor:  Normal  Speech/Language:   Normal Rate  Affect:  Appropriate  Mood:  sad  Thought process:  normal  Thought content:    WNL  Sensory/Perceptual disturbances:    WNL  Orientation:  oriented to person, place, time/date, and situation  Attention:  Good  Concentration:  Good  Memory:  WNL  Fund of knowledge:   Good  Insight:    Good  Judgment:   Good  Impulse Control:  Good   Risk Assessment: Danger to Self:  No Self-injurious Behavior: No Danger to Others: No Duty to Warn:no Physical Aggression / Violence:No  Access to Firearms a concern: No  Gang Involvement:No   Subjective: Patient was present for session.  She shared that things are still difficult with her mother and she has been able to set some limits with her.  Patient went on to report that things are progressing with her current relationship and that has been positive.  Discussed the importance of her continuing to take things at a pace that she is comfortable.  She went on to share that the biggest concern she has currently is her daughters.  She shared that her youngest is very clingy and not wanting to sleep in her own bed.  Patient stated they have been working on bedtimes rituals and even when she has her start off in her bed she sometimes ends up in patient's bed and cannot remember how she gets there.  Discussed the importance of having her think through how she can feel safe and how she can release some of the negative emotions appropriately.  Encouraged patient to work on getting her to release her emotions through physical activity.  Also discussed the  possibility of different strategies to release negative emotions examples-cotton ball fight, drawling, or dancing.  Patient was encouraged to remind herself that she is doing well and that some of what she is going through is developmental but that it is important that she take time for herself and that she continue to teach her daughter's life skills on how to manage emotions appropriately.  Interventions: Cognitive Behavioral Therapy and Solution-Oriented/Positive Psychology  Diagnosis:   ICD-10-CM   1. Generalized anxiety disorder  F41.1       Plan: Patient is to use CBT and coping skills to decrease anxiety symptoms.  Patient is to follow plans from session to help her daughters manage emotions appropriately.  Patient is to continue releasing negative emotions through exercise.  Patient is to continue working on limit setting with her mother. Long-term goal: Resolve the core conflict that is a source of anxiety.  Decrease triggered responses that occur when having to deal with her ex-husband by 50% Short-term goal: Identify the major life complex in the past and present the form the basis for present anxiety  Stevphen Meuse, [facility]

## 2021-02-20 ENCOUNTER — Ambulatory Visit: Payer: 59 | Admitting: Psychiatry

## 2021-03-02 DIAGNOSIS — E039 Hypothyroidism, unspecified: Secondary | ICD-10-CM | POA: Insufficient documentation

## 2021-03-07 ENCOUNTER — Ambulatory Visit (INDEPENDENT_AMBULATORY_CARE_PROVIDER_SITE_OTHER): Payer: 59 | Admitting: Psychiatry

## 2021-03-07 ENCOUNTER — Other Ambulatory Visit: Payer: Self-pay

## 2021-03-07 ENCOUNTER — Ambulatory Visit: Payer: Self-pay | Admitting: Sports Medicine

## 2021-03-07 DIAGNOSIS — F411 Generalized anxiety disorder: Secondary | ICD-10-CM | POA: Diagnosis not present

## 2021-03-07 NOTE — Progress Notes (Signed)
°      [facility] Counselor/Therapist Progress Note  Patient ID: Sabrina Nichols, MRN: [id number],    Date: 03/07/2021  Time Spent: 51 minutes start time 1:08 PM end time 1:59 PM  Treatment Type: Individual Therapy  Reported Symptoms: anxiety, sadness, triggered responses  Mental Status Exam:  Appearance:   Casual     Behavior:  Appropriate  Motor:  Normal  Speech/Language:   Normal Rate  Affect:  Appropriate  Mood:  anxious  Thought process:  normal  Thought content:    WNL  Sensory/Perceptual disturbances:    WNL  Orientation:  oriented to person, place, time/date, and situation  Attention:  Good  Concentration:  Good  Memory:  WNL  Fund of knowledge:   Good  Insight:    Good  Judgment:   Good  Impulse Control:  Good   Risk Assessment: Danger to Self:  No Self-injurious Behavior: No Danger to Others: No Duty to Warn:no Physical Aggression / Violence:No  Access to Firearms a concern: No  Gang Involvement:No   Subjective: Patient was present for session.  She recognized that she got triggered by her situation with her dad.  Patient explained she knows that was creating sadness for her.  Did EMDR set on dad not bringing something back from his trip.  Suds level 8, negative cognition "I am not important" felt sadness and guilt in her chest and stomach.  Patient was able to reduce suds level to 5.  She was able to recognize that she has always struggled with issues with her dad and feeling that she has to do certain things to be enough.  Encouraged patient to affirm herself regularly and to remind herself that she is valuable and enough.  Interventions: Cognitive Behavioral Therapy, Solution-Oriented/Positive Psychology, and Eye Movement Desensitization and Reprocessing (EMDR)  Diagnosis:   ICD-10-CM   1. Generalized anxiety disorder  F41.1       Plan: Patient is to use CBT and coping skills to decrease anxiety symptoms.  Patient is to follow plans from session to help her  daughters manage emotions appropriately.  Patient is to continue releasing negative emotions through exercise.  Patient is to continue working on limit setting with her mother. Long-term goal: Resolve the core conflict that is a source of anxiety.  Decrease triggered responses that occur when having to deal with her ex-husband by 50% Short-term goal: Identify the major life complex in the past and present the form the basis for present anxiety  Stevphen Meuse, [facility]

## 2021-03-27 ENCOUNTER — Other Ambulatory Visit: Payer: Self-pay

## 2021-03-27 ENCOUNTER — Ambulatory Visit (INDEPENDENT_AMBULATORY_CARE_PROVIDER_SITE_OTHER): Payer: 59 | Admitting: Psychiatry

## 2021-03-27 DIAGNOSIS — F411 Generalized anxiety disorder: Secondary | ICD-10-CM

## 2021-03-27 NOTE — Progress Notes (Signed)
?    [facility] Counselor/Therapist Progress Note ? ?Patient ID: Sabrina Nichols, MRN: [id number],   ? ?Date: 03/27/2021 ? ?Time Spent: 51 minutes start 12:10 PM end time 1:01 PM ? ?Treatment Type: Individual Therapy ? ?Reported Symptoms: anxiety, sadness ? ?Mental Status Exam: ? ?Appearance:   Well Groomed     ?Behavior:  Appropriate  ?Motor:  Normal  ?Speech/Language:   Normal Rate  ?Affect:  Appropriate  ?Mood:  anxious  ?Thought process:  normal  ?Thought content:    WNL  ?Sensory/Perceptual disturbances:    WNL  ?Orientation:  oriented to person, place, time/date, and situation  ?Attention:  Good  ?Concentration:  Good  ?Memory:  WNL  ?Fund of knowledge:   Good  ?Insight:    Good  ?Judgment:   Good  ?Impulse Control:  Good  ? ?Risk Assessment: ?Danger to Self:  No ?Self-injurious Behavior: No ?Danger to Others: No ?Duty to Warn:no ?Physical Aggression / Violence:No  ?Access to Firearms a concern: No  ?Gang Involvement:No  ? ?Subjective: Patient was present for session.  She shared she got triggered by a comment that her daughter made.  She explained that her daughter shared after a therapy session with her father and his clinician that she was upset with patient due to not being more positive regarding her father's choices.  Allowed patient time to process the situation and helped her think of ways that she can explain things to her daughter if she needs to in the future.  Patient stated she typically just does not communicate anything except what father shares and tries to allow her daughter to express what she needs to express.  Patient was encouraged to realize that she can only focus on her role in the situation and allow her daughter to figure out what the truth is with time.  She went on to share that the situation with her parents is creating the most stress for her.  She is still not sure whether or not her parents are going to separate which is a very weird feeling at the age she is currently.  Patient  acknowledged she still feels a lot of sadness about the whole situation.  Patient and her brother were able to have her parents sit down together and set appropriate limits.  Patient was encouraged to feel positive about how she handled it and to feel good about her limit setting.  She shared that things are continuing to go positively with her boyfriend and she is able to see how he is different from previous men in her life.  Encouraged her to recognize the healthy ways that they interact and ways for her to continue working on positive interactions with him.  Patient was encouraged to continue focusing on her own self-care as she works on dealing with difficult situations.  Patient is also to work on limit setting.  We will work on processing further her parents relationship at next session. ? ?Interventions: Solution-Oriented/Positive Psychology and Insight-Oriented ? ?Diagnosis: ?  ICD-10-CM   ?1. Generalized anxiety disorder  F41.1   ?  ? ? ?Plan:  Patient is to use CBT and coping skills to decrease anxiety symptoms.  Patient is to follow plans from session to help her daughters deal with the situation with her father appropriately.  Patient is to continue releasing negative emotions through exercise.  Patient is to continue working on limit setting with her mother. ?Long-term goal: Resolve the core conflict that is a source of anxiety.  Decrease triggered responses that occur when having to deal with her ex-husband by 50% ?Short-term goal: Identify the major life complex in the past and present the form the basis for present anxiety ? ?Sabrina Nichols, [facility] ? ? ? ? ? ? ? ? ? ? ? ? ? ? ? ? ? ? ?

## 2021-04-06 ENCOUNTER — Ambulatory Visit (INDEPENDENT_AMBULATORY_CARE_PROVIDER_SITE_OTHER): Payer: Managed Care, Other (non HMO) | Admitting: Sports Medicine

## 2021-04-06 DIAGNOSIS — M79672 Pain in left foot: Secondary | ICD-10-CM | POA: Diagnosis not present

## 2021-04-06 NOTE — Progress Notes (Signed)
? ?Sabrina Nichols is a 51 y.o. female who presents to [facility] today for the following: ? ?Left Lateral Foot/Ankle Pain ?Played fall season of [place] ?In Nov started having lateral foot/ankle pain ?Saw Dr. Erlinda Hong and had Xrs that showed os trigonum, no acute fracture ?Told that she had peroneal tendinitis ?Has been doing calf stretches and home remedies including taking ibuprofen that helps slightly ?But continues to have pain ?Pain is mostly worse in the mornings and gets throbbing pain with walking a mile ?No recent injuries ?Wears custom orthotics in tennis shoes, but was playing beach volleyball barefoot ? ?PMH reviewed.  ?ROS as above. ?Medications reviewed. ? ?Exam:  ?BP 122/84   Ht 5\' 5"  (1.651 m)   Wt 155 lb (70.3 kg)   BMI 25.79 kg/m?  ?Gen: Well NAD ?MSK: ? ?Left ankle/foot: ?- Inspection: She has significant pes planus bilaterally with pronation on the left greater than the right, otherwise no overlying skin changes, erythema, or ecchymosis.  She has some swelling in the sinus tarsi on the left.  Calcaneal valgus on the left as well. ?- Palpation: She has some tenderness palpation over the distal fibula, peroneal tendons just posterior to the lateral malleolus, sinus tarsi, proximal fourth metatarsal ?- Strength: Normal strength with dorsiflexion, plantarflexion, inversion, and eversion of foot; flexion and extension of toes b/l ?- ROM: Full ROM b/l ?- Neuro/vasc: NV intact distally bilaterally ?- Special Tests: Negative anterior drawer, normal inversion test.  Negative syndesmotic compression.  Normal calcaneal inversion. ? ?Limited ultrasound of left ankle shows peroneal tendons in transverse and longitudinal axis from musculotendinous junction to the insertion of the peroneus brevis at the base of the fifth metatarsal without any evidence of tenosynovitis or tearing noted.  No evidence of subluxation.  Fibular shaft also visualized in transverse and longitudinal axis without any cortical  irregularity, effusion, or Doppler flow noted over the areas of tenderness.  Fourth and fifth metatarsal shafts also visualized in transverse and longitudinal axis without any cortical irregularities or hypoechoic areas of swelling.  In the soft tissue over the fourth metatarsal there is some slight increase blood flow, but not specifically over the metatarsal.  Negative Doppler flow over fourth and fifth metatarsals.  Scanning of the peroneus tertius shows a hypoechoic collection consistent with a ganglion cyst off of the tendon sheath near the area of the fourth extensor tendon as well.  This corresponds to the region of her pain on sono palpation.  Negative overlying Doppler flow in this area. ? ?Impression: Left peroneus tertius injury with resultant ganglion cyst formation. ? ?Ultrasound and interpretation by Wolfgang Phoenix. Oneida Alar, MD and [place] Constable, DO  ? ? ?No results found. ? ? ?Assessment and Plan: ?1) Left foot pain ?Findings are most consistent with an injury to the peroneus tertius tendon.  She has significant pronation and swelling within the sinus tarsi.  She was given a left lateral heel wedge to try to take some of the pressure off of this area and advised to use an arch strap while doing strenuous activities to hopefully help with the swelling and pain.  Given isometric dorsiflexion exercises as well.  We will have her follow-up in 4 to 5 weeks to assess how she is doing at that time. ? ? ?Arizona Constable, D.O.  ?PGY-4 [facility]  ?04/06/2021 5:29 PM ? ?I observed and examined the patient with the resident and agree with assessment and plan.  Note reviewed and modified by me. ?Ila Mcgill, MD ?

## 2021-04-06 NOTE — Assessment & Plan Note (Signed)
Findings are most consistent with an injury to the peroneus tertius tendon.  She has significant pronation and swelling within the sinus tarsi.  She was given a left lateral heel wedge to try to take some of the pressure off of this area and advised to use an arch strap while doing strenuous activities to hopefully help with the swelling and pain.  Given isometric dorsiflexion exercises as well.  We will have her follow-up in 4 to 5 weeks to assess how she is doing at that time. ?

## 2021-04-06 NOTE — Patient Instructions (Signed)
Thank you for coming to see me today. It was a pleasure. Today we talked about:  ? ?You seem to have injured your peroneus tertius tendon that now has some swelling around it.  We were able to see this on ultrasound.  Try pulling your toes up while standing and also pulling them up against a step or a tablet and holding for 10 seconds.  Using the compression while doing strenuous activity can also be helpful ? ?Please follow-up with [place] in 4-5 weeks. ? ?If you have any questions or concerns, please do not hesitate to call the office at [phone]. ? ?Best,  ? ?[place] Constable, DO ?[place]  ?

## 2021-04-10 ENCOUNTER — Ambulatory Visit: Payer: 59 | Admitting: Psychiatry

## 2021-04-12 ENCOUNTER — Other Ambulatory Visit: Payer: Self-pay

## 2021-04-12 ENCOUNTER — Ambulatory Visit (INDEPENDENT_AMBULATORY_CARE_PROVIDER_SITE_OTHER): Payer: 59 | Admitting: Psychiatry

## 2021-04-12 DIAGNOSIS — F411 Generalized anxiety disorder: Secondary | ICD-10-CM | POA: Diagnosis not present

## 2021-04-12 NOTE — Progress Notes (Signed)
?      [facility] Counselor/Therapist Progress Note ? ?Patient ID: Sabrina Nichols, MRN: [id number],   ? ?Date: 04/12/2021 ? ?Time Spent: 58 minutes start time 9:02 AM end time 10:00 AM ? ?Treatment Type: Individual Therapy ? ?Reported Symptoms: anxiety, sadness, triggered responses, rumination ? ?Mental Status Exam: ? ?Appearance:   Casual and Neat     ?Behavior:  Appropriate  ?Motor:  Normal  ?Speech/Language:   Normal Rate  ?Affect:  Appropriate  ?Mood:  anxious  ?Thought process:  normal  ?Thought content:    WNL  ?Sensory/Perceptual disturbances:    WNL  ?Orientation:  oriented to person, place, time/date, and situation  ?Attention:  Good  ?Concentration:  Good  ?Memory:  WNL  ?Fund of knowledge:   Good  ?Insight:    Good  ?Judgment:   Good  ?Impulse Control:  Good  ? ?Risk Assessment: ?Danger to Self:  No ?Self-injurious Behavior: No ?Danger to Others: No ?Duty to Warn:no ?Physical Aggression / Violence:No  ?Access to Firearms a concern: No  ?Gang Involvement:No  ? ?Subjective: Patient was present for session.  She shared she has been struggling due to a situation with her ex that was extremely triggering.  Patient did processing set on the text messages from her ex, suds level 10, negative cognition "I am not enough" felt sadness and anxiety in her chest.  Patient was able to reduce suds level to 5.  She was able to recognize that her father has some similar behaviors and that is part of the cycle.  She also acknowledged that because her children are hurting in the situation that makes it even more triggering for her.  Encouraged her to realize that she has done everything she can for her children and she cannot change the fact that her father and her ex are making the decisions that they are.  She was encouraged to focus on what she can do something about and to affirm herself regularly. ? ?Interventions: Cognitive Behavioral Therapy, Solution-Oriented/Positive Psychology, Eye Movement Desensitization and  Reprocessing (EMDR), and Insight-Oriented ? ?Diagnosis: ?  ICD-10-CM   ?1. Generalized anxiety disorder  F41.1   ?  ? ? ?Plan: Patient is to use CBT and coping skills to decrease anxiety symptoms.  Patient is to continue working on setting appropriate limits with her ex and her father and to remind herself that she is enough and she matters.  Patient is to continue releasing negative emotions through exercise.  Patient is to continue working on limit setting with her mother. ?Long-term goal: Resolve the core conflict that is a source of anxiety.  Decrease triggered responses that occur when having to deal with her ex-husband by 50% ?Short-term goal: Identify the major life complex in the past and present the form the basis for present anxiety ? ?Tonnya Sayre, [facility] ? ? ? ? ? ? ? ? ? ? ? ? ? ? ? ? ? ? ?

## 2021-04-20 ENCOUNTER — Ambulatory Visit: Payer: 59 | Admitting: Psychiatry

## 2021-04-20 ENCOUNTER — Ambulatory Visit (INDEPENDENT_AMBULATORY_CARE_PROVIDER_SITE_OTHER): Payer: 59 | Admitting: Psychiatry

## 2021-04-20 DIAGNOSIS — F411 Generalized anxiety disorder: Secondary | ICD-10-CM

## 2021-04-20 NOTE — Progress Notes (Signed)
?      [facility] Counselor/Therapist Progress Note ? ?Patient ID: Sabrina Nichols, MRN: [id number],   ? ?Date: 04/20/2021 ? ?Time Spent: 51 minutes start time 8:09 AM end time 9:00 AM ? ?Treatment Type: Individual Therapy ? ?Reported Symptoms: anxiety, irritability, triggered responses ? ?Mental Status Exam: ? ?Appearance:   Casual and Neat     ?Behavior:  Appropriate  ?Motor:  Normal  ?Speech/Language:   Normal Rate  ?Affect:  Appropriate  ?Mood:  normal  ?Thought process:  normal  ?Thought content:    WNL  ?Sensory/Perceptual disturbances:    WNL  ?Orientation:  oriented to person, place, time/date, and situation  ?Attention:  Good  ?Concentration:  Good  ?Memory:  WNL  ?Fund of knowledge:   Good  ?Insight:    Good  ?Judgment:   Good  ?Impulse Control:  Good  ? ?Risk Assessment: ?Danger to Self:  No ?Self-injurious Behavior: No ?Danger to Others: No ?Duty to Warn:no ?Physical Aggression / Violence:No  ?Access to Firearms a concern: No  ?Gang Involvement:No  ? ?Subjective: Patient was present for session.  She shared that things are busy with work but it things are going well.  Patient stated she is doing better with a lot of things but got very triggered with her boyfriend and she is not sure why she got so upset.  Thing that triggered what her was him saying something that she found not appropriate, suds level 7, negative cognition "I have poor judgment" felt anxiety in her chest.  Patient was able to reduce suds level to 4.  She was able to recognize that she has to work on letting go of hurts and recognizing that people will hurt her and she will hurt others but she does not need to put up walls to protect herself.  Agreed to continue working on issue at next session. ? ?Interventions: Solution-Oriented/Positive Psychology, Eye Movement Desensitization and Reprocessing (EMDR), and Insight-Oriented ? ?Diagnosis: ?  ICD-10-CM   ?1. Generalized anxiety disorder  F41.1   ?  ? ? ?Plan: Patient is to use CBT and coping  skills to decrease anxiety symptoms.  Patient is to continue working on setting appropriate limits with her ex and her father and to remind herself that she is enough and she matters.  Patient is to continue releasing negative emotions through exercise.  Patient is to continue working on limit setting with her mother. ?Long-term goal: Resolve the core conflict that is a source of anxiety.  Decrease triggered responses that occur when having to deal with her ex-husband by 50% ?Short-term goal: Identify the major life complex in the past and present the form the basis for present anxiety ? ?Stevphen Meuse, [facility] ? ? ? ? ? ? ? ? ? ? ? ? ? ? ? ? ? ? ?

## 2021-05-02 ENCOUNTER — Ambulatory Visit: Payer: 59 | Admitting: Psychiatry

## 2021-05-05 ENCOUNTER — Telehealth: Payer: Self-pay | Admitting: *Deleted

## 2021-05-05 NOTE — Telephone Encounter (Signed)
Pt call stating her foot has not gotten that much better and wanted to know what she could do before her May 16th appt with Dr Darrick Penna. The compression sleeve is not working that well. Gave her an option of seeing the foot specialist and she agreed. Was able to get her an appt with: ? ?Dr Nicki Guadalajara ?[facility] ?[address] ?[place] ?[phone] ?Wed Apr 26th @ 9a ? ?Pt informed of this appt.  ?

## 2021-05-09 ENCOUNTER — Encounter: Payer: Self-pay | Admitting: Family Medicine

## 2021-05-09 ENCOUNTER — Ambulatory Visit: Payer: Managed Care, Other (non HMO) | Admitting: Family Medicine

## 2021-05-09 VITALS — Temp 98.3°F | Ht 65.0 in | Wt 164.4 lb

## 2021-05-09 DIAGNOSIS — E663 Overweight: Secondary | ICD-10-CM

## 2021-05-09 DIAGNOSIS — F4322 Adjustment disorder with anxiety: Secondary | ICD-10-CM | POA: Diagnosis not present

## 2021-05-09 DIAGNOSIS — E039 Hypothyroidism, unspecified: Secondary | ICD-10-CM | POA: Diagnosis not present

## 2021-05-09 DIAGNOSIS — R87619 Unspecified abnormal cytological findings in specimens from cervix uteri: Secondary | ICD-10-CM | POA: Insufficient documentation

## 2021-05-09 DIAGNOSIS — R5383 Other fatigue: Secondary | ICD-10-CM | POA: Diagnosis not present

## 2021-05-09 DIAGNOSIS — Z79890 Hormone replacement therapy (postmenopausal): Secondary | ICD-10-CM | POA: Diagnosis not present

## 2021-05-09 DIAGNOSIS — Z9889 Other specified postprocedural states: Secondary | ICD-10-CM | POA: Insufficient documentation

## 2021-05-09 DIAGNOSIS — N951 Menopausal and female climacteric states: Secondary | ICD-10-CM

## 2021-05-09 DIAGNOSIS — L719 Rosacea, unspecified: Secondary | ICD-10-CM | POA: Insufficient documentation

## 2021-05-09 HISTORY — DX: Adjustment disorder with anxiety: F43.22

## 2021-05-09 LAB — COMPREHENSIVE METABOLIC PANEL
ALT: 23 U/L (ref 0–35)
AST: 37 U/L (ref 0–37)
Albumin: 4.5 g/dL (ref 3.5–5.2)
Alkaline Phosphatase: 45 U/L (ref 39–117)
BUN: 17 mg/dL (ref 6–23)
CO2: 29 mEq/L (ref 19–32)
Calcium: 9.8 mg/dL (ref 8.4–10.5)
Chloride: 103 mEq/L (ref 96–112)
Creatinine, Ser: 1 mg/dL (ref 0.40–1.20)
GFR: 65.97 mL/min (ref >60.00)
Glucose, Bld: 93 mg/dL (ref 70–99)
Potassium: 4.1 mEq/L (ref 3.5–5.1)
Sodium: 140 mEq/L (ref 135–145)
Total Bilirubin: 0.6 mg/dL (ref 0.2–1.2)
Total Protein: 7.6 g/dL (ref 6.0–8.3)

## 2021-05-09 LAB — CBC WITH DIFFERENTIAL/PLATELET
Basophils Absolute: 0 10*3/uL (ref 0.0–0.1)
Basophils Relative: 0.6 % (ref 0.0–3.0)
Eosinophils Absolute: 0.1 10*3/uL (ref 0.0–0.7)
Eosinophils Relative: 2 % (ref 0.0–5.0)
HCT: 37.6 % (ref 36.0–46.0)
Hemoglobin: 12.6 g/dL (ref 12.0–15.0)
Lymphocytes Relative: 45.9 % (ref 12.0–46.0)
Lymphs Abs: 3.1 10*3/uL (ref 0.7–4.0)
MCHC: 33.5 g/dL (ref 30.0–36.0)
MCV: 91.9 fl (ref 78.0–100.0)
Monocytes Absolute: 0.3 10*3/uL (ref 0.1–1.0)
Monocytes Relative: 4.3 % (ref 3.0–12.0)
Neutro Abs: 3.2 10*3/uL (ref 1.4–7.7)
Neutrophils Relative %: 47.2 % (ref 43.0–77.0)
Platelets: 267 10*3/uL (ref 150.0–400.0)
RBC: 4.09 Mil/uL (ref 3.87–5.11)
RDW: 13 % (ref 11.5–15.5)
WBC: 6.7 10*3/uL (ref 4.0–10.5)

## 2021-05-09 LAB — LIPID PANEL
Cholesterol: 198 mg/dL (ref 0–200)
HDL: 89.9 mg/dL (ref >39.00)
LDL Cholesterol: 92 mg/dL (ref 0–99)
NonHDL: 107.9
Total CHOL/HDL Ratio: 2
Triglycerides: 82 mg/dL (ref 0.0–149.0)
VLDL: 16.4 mg/dL (ref 0.0–40.0)

## 2021-05-09 LAB — TSH: TSH: 1.82 u[IU]/mL (ref 0.35–5.50)

## 2021-05-09 LAB — VITAMIN D 25 HYDROXY (VIT D DEFICIENCY, FRACTURES): VITD: 64.18 ng/mL (ref 30.00–100.00)

## 2021-05-09 NOTE — Patient Instructions (Signed)
Please return in 3 months for recheck.  ? ?I will release your lab results to you on your MyChart account with further instructions. You may see the results before I do, but when I review them I will send you a message with my report or have my assistant call you if things need to be discussed. Please reply to my message with any questions. Thank you!  ? ?It was a pleasure meeting you today! Thank you for choosing [place] to meet your healthcare needs! I truly look forward to working with you. If you have any questions or concerns, please send me a message via Mychart or call the office at [phone].  ?

## 2021-05-09 NOTE — Progress Notes (Signed)
vit ? ?Subjective  ?CC:  ?Chief Complaint  ?Patient presents with  ? Establish Care  ?  Pt stated that she feels like her hormones are off and feels like they are getting worse. Hot flashes and feels like she can not loose wt. She has been eating healthy.  ? ? ?HPI: Sabrina BlackerLina Nichols is a 51 y.o. female who presents to [facility] [place] today to establish care with me as a new patient.  ? ?She has the following concerns or needs: ?Very pleasant 51 year old divorced female, mother of 2 young children, and a relationship, presents to establish care.  I reviewed records from her prior PCP.  Also sees dermatology, orthopedics, sports medicine, GYN and gastroenterology. ?Had colonoscopy for screening purposes 2 years ago.  Colon polyps present.  For 5-year surveillance.  We will request records from Dr. Loreta AveMann. ?Sees GYN and reports normal Pap smear in January.  I reviewed labs from this visit including estradiol, testosterone, thyroid panel, CBC.  TSH was 0.45.  She reports normal mammogram as well.  He has remote history of abnormal Pap smear and LEEP procedure done in her 5420s.  Normal follow-up since.  GYN is taking over her HRT for perimenopausal symptoms. ?Has noted weight gain, some hot flushes.  He has irregular menses. ?Hypothyroidism on NP thyroid 120 mcg daily.  In the past, TSH was pushed to the low normal.  Now in the normal range.  This could be contributing to her perceived weight gain. ?Overweight: Eats a healthy diet and does exercise.  Weight gain is mostly over the last year or 2.  Our scale today is 6 pounds higher than her normal ?Anxiety: Has adjustment anxiety related to divorce and stress.  Life is much better now.  On Lexapro for the last year or 2.  Had been on Zoloft in the past.  Feels this is well controlled. ? ?Wt Readings from Last 3 Encounters:  ?05/09/21 164 lb 6.4 oz (74.6 kg)  ?04/06/21 155 lb (70.3 kg)  ?08/03/20 156 lb 6.4 oz (70.9 kg)  ? ? ?Assessment  ?1. Acquired  hypothyroidism   ?2. Other fatigue   ?3. Adjustment disorder with anxiety   ?4. Hormone replacement therapy (HRT)   ?5. Perimenopausal   ?6. Overweight (BMI 25.0-29.9)   ? ?  ?Plan  ?Hypothyroidism: We will recheck levels today to ensure it is stable. ?Discussed perimenopausal symptoms including fatigue.  Check for lab work to ensure no other causes. ?Anxiety well-controlled on Lexapro 10 mg daily.  Given weight gain, will decrease to 5 mg daily and try to wean from there if able. ?Overweight: Has gained a little bit of weight and this is likely multifactorial.  Discussed diet, exercise, decreasing Lexapro dose, monitoring thyroid.  We will check lab work. ?HRT: Per GYN ? ?Follow up: 3 months for recheck ?Orders Placed This Encounter  ?Procedures  ? CBC with Differential/Platelet  ? Comprehensive metabolic panel  ? Lipid panel  ? TSH  ? VITAMIN D 25 Hydroxy (Vit-D Deficiency, Fractures)  ? ?No orders of the defined types were placed in this encounter. ? ?  ? ?  05/09/2021  ? 11:38 AM 03/21/2020  ?  9:14 AM 11/30/2019  ?  1:45 PM  ?Depression screen PHQ 2/9  ?Decreased Interest 0 0 0  ?Down, Depressed, Hopeless 0 0 0  ?PHQ - 2 Score 0 0 0  ?Altered sleeping 1 0 0  ?Tired, decreased energy 0 0 0  ?Change in  appetite 0 0 0  ?Feeling bad or failure about yourself  0 0 0  ?Trouble concentrating 0 0 0  ?Moving slowly or fidgety/restless 0 0 0  ?Suicidal thoughts 0 0 0  ?PHQ-9 Score 1 0 0  ?Difficult doing work/chores Not difficult at all Not difficult at all Not difficult at all  ? ? ?We updated and reviewed the patient's past history in detail and it is documented below.  ?Patient Active Problem List  ? Diagnosis Date Noted  ? Abnormal cervical Papanicolaou smear 05/09/2021  ? H/O LEEP 05/09/2021  ? Rosacea 05/09/2021  ? Adjustment disorder with anxiety 05/09/2021  ?  Started lexapro 2022; after divorce ? ?  ? Acquired hypothyroidism 03/02/2021  ? Chronic pain of both knees 11/21/2017  ? Pes planus 09/16/2008  ? ?Health  Maintenance  ?Topic Date Due  ? Hepatitis C Screening  Never done  ? TETANUS/TDAP  Never done  ? COVID-19 Vaccine (3 - Booster for Janssen series) 05/25/2021 (Originally 02/08/2020)  ? INFLUENZA VACCINE  08/15/2021  ? COLONOSCOPY (Pts 45-77yrs Insurance coverage will need to be confirmed)  12/27/2024  ? PAP SMEAR-Modifier  01/19/2026  ? HIV Screening  Completed  ? HPV VACCINES  Aged Out  ? ?Immunization History  ?Administered Date(s) Administered  ? Janssen (J&J) SARS-COV-2 Vaccination 04/16/2019  ? Moderna Sars-Covid-2 Vaccination 12/14/2019  ? ?Current Meds  ?Medication Sig  ? Cholecalciferol (VITAMIN D3) 250 MCG (10000 UT) capsule Take 10,000 Units by mouth daily.   ? escitalopram (LEXAPRO) 10 MG tablet   ? NP THYROID 120 MG tablet Take 1 tablet (120 mg total) by mouth daily before breakfast.  ? progesterone (PROMETRIUM) 200 MG capsule   ? Testosterone Propionate (FIRST-TESTOSTERONE MC) 2 % CREA Place 5 mg onto the skin daily.  ? ? ?Allergies: ?Patient is allergic to percocet [oxycodone-acetaminophen]. ?Past Medical History ?Patient  has a past medical history of Adjustment disorder with anxiety (05/09/2021), Anxiety, Thyroid disease, Varicose veins, and Vertigo. ?Past Surgical History ?Patient  has a past surgical history that includes laproscopic surgery to remove IUD (03/15/2008); Cesarean section (11/16/2007); and LEEP. ?Family History: ?Patient family history includes Deep vein thrombosis in her mother. ?Social History:  ?Patient  reports that she has never smoked. She has never used smokeless tobacco. She reports current alcohol use. She reports that she does not use drugs. ? ?Review of Systems: ?Constitutional: negative for fever or malaise ?Ophthalmic: negative for photophobia, double vision or loss of vision ?Cardiovascular: negative for chest pain, dyspnea on exertion, or new LE swelling ?Respiratory: negative for SOB or persistent cough ?Gastrointestinal: negative for abdominal pain, change in bowel  habits or melena ?Genitourinary: negative for dysuria or gross hematuria ?Musculoskeletal: negative for new gait disturbance or muscular weakness ?Integumentary: negative for new or persistent rashes ?Neurological: negative for TIA or stroke symptoms ?Psychiatric: negative for SI or delusions ?Allergic/Immunologic: negative for hives ? ?Patient Care Team  ?  Relationship Specialty Notifications Start End  ?Willow Ora, MD PCP - General Family Medicine  05/09/21   ?Marcelle Overlie, MD Consulting Physician Obstetrics and Gynecology  05/09/21   ?Charna Elizabeth, MD Consulting Physician Gastroenterology  05/09/21   ?Stevphen Meuse, [facility] Counselor Psychiatry  05/09/21   ?Tarry Kos, MD Consulting Physician Orthopedic Surgery  05/09/21   ? ? ?Objective  ?Vitals: Temp 98.3 ?F (36.8 ?C)   Ht 5\' 5"  (1.651 m)   Wt 164 lb 6.4 oz (74.6 kg)   SpO2 98%   BMI 27.36 kg/m?  ?  General:  Well developed, well nourished, no acute distress  ?Psych:  Alert and oriented,normal mood and affect ?HEENT:  Normocephalic, atraumatic, non-icteric sclera, supple neck without adenopathy, mass or thyromegaly ?Cardiovascular:  RRR without gallop, rub or murmur ?Respiratory:  Good breath sounds bilaterally, CTAB with normal respiratory effort ?Gastrointestinal: normal bowel sounds, soft, non-tender, no noted masses. No HSM ?MSK: no deformities, contusions. Joints are without erythema or swelling ?Skin:  Warm, no rashes or suspicious lesions noted ?Neurologic:    Mental status is normal. Gross motor and sensory exams are normal. Normal gait ? ?Commons side effects, risks, benefits, and alternatives for medications and treatment plan prescribed today were discussed, and the patient expressed understanding of the given instructions. Patient is instructed to call or message via MyChart if he/she has any questions or concerns regarding our treatment plan. No barriers to understanding were identified. We discussed Red Flag symptoms and signs in detail.  Patient expressed understanding regarding what to do in case of urgent or emergency type symptoms.  ?Medication list was reconciled, printed and provided to the patient in AVS. Patient instructions and summary inform

## 2021-05-16 ENCOUNTER — Ambulatory Visit (INDEPENDENT_AMBULATORY_CARE_PROVIDER_SITE_OTHER): Payer: Commercial Managed Care - HMO | Admitting: Psychiatry

## 2021-05-16 DIAGNOSIS — F411 Generalized anxiety disorder: Secondary | ICD-10-CM

## 2021-05-16 NOTE — Progress Notes (Signed)
?      [facility] Counselor/Therapist Progress Note ? ?Patient ID: Sabrina Nichols, MRN: [id number],   ? ?Date: 05/16/2021 ? ?Time Spent: 51 minutes start time 2:07 PM end time 2:58 PM ? ?Treatment Type: Individual Therapy ? ?Reported Symptoms: sadness, pain issues, anxiety ? ?Mental Status Exam: ? ?Appearance:   Casual     ?Behavior:  Appropriate  ?Motor:  Normal  ?Speech/Language:   Normal Rate  ?Affect:  Appropriate  ?Mood:  anxious  ?Thought process:  normal  ?Thought content:    WNL  ?Sensory/Perceptual disturbances:    WNL  ?Orientation:  oriented to person, place, time/date, and situation  ?Attention:  Good  ?Concentration:  Good  ?Memory:  WNL  ?Fund of knowledge:   Good  ?Insight:    Good  ?Judgment:   Good  ?Impulse Control:  Good  ? ?Risk Assessment: ?Danger to Self:  No ?Self-injurious Behavior: No ?Danger to Others: No ?Duty to Warn:no ?Physical Aggression / Violence:No  ?Access to Firearms a concern: No  ?Gang Involvement:No  ? ?Subjective: Patient was present for session. She shared that she was frustrated due to and injury that she has been dealing with since the fall.  Patient is going down on her Lexapro due to there are not any major life events at this time.  She also is feeling better overall currently.  Patient shared that she seems to be doing okay with family and was managing her ex and the situation with him appropriately.  She shared that things seem to be going okay with her daughters overall even though he still disappoints them they are managing and are talking to her about their feelings.  Patient stated she had one triggered episode when she felt that her boyfriend was very cold, suds level 6, negative cognition "I did something wrong" felt anxiety fear and sadness in her chest and stomach.  Patient was able to reduce suds level to 3 and reported feeling much better at end of session.  She was able to recognize that that feeling and feeling responsible for everything started when she was  very young and has continued.  Ways to affirm the younger part of her and remind herself that she is not responsible for everything was discussed with patient. ? ?Interventions: Cognitive Behavioral Therapy, Solution-Oriented/Positive Psychology, and Eye Movement Desensitization and Reprocessing (EMDR) ? ?Diagnosis: ?  ICD-10-CM   ?1. Generalized anxiety disorder  F41.1   ?  ? ? ?Plan: Patient is to use CBT and coping skills to decrease anxiety symptoms.  Patient is to continue working on setting appropriate limits with her ex and her father and to remind herself that she is enough and she matters.  Patient is to continue releasing negative emotions through exercise.  Patient is to continue working on limit setting with her mother.  Patient is to affirm herself regularly.Marland Kitchen ?Long-term goal: Resolve the core conflict that is a source of anxiety.  Decrease triggered responses that occur when having to deal with her ex-husband by 50% ?Short-term goal: Identify the major life complex in the past and present the form the basis for present anxiety ? ?Stevphen Meuse, [facility] ? ? ? ? ? ? ? ? ? ? ? ? ? ? ? ? ? ? ?

## 2021-05-30 ENCOUNTER — Ambulatory Visit: Payer: Managed Care, Other (non HMO) | Admitting: Sports Medicine

## 2021-06-26 ENCOUNTER — Ambulatory Visit: Payer: 59 | Admitting: Psychiatry

## 2021-07-05 ENCOUNTER — Ambulatory Visit (INDEPENDENT_AMBULATORY_CARE_PROVIDER_SITE_OTHER): Payer: Commercial Managed Care - HMO | Admitting: Psychiatry

## 2021-07-05 DIAGNOSIS — F411 Generalized anxiety disorder: Secondary | ICD-10-CM

## 2021-07-05 NOTE — Progress Notes (Signed)
      [facility] Counselor/Therapist Progress Note  Patient ID: Sabrina Nichols, MRN: [id number],    Date: 07/05/2021  Time Spent: 55 minutes start time 9:08 AM end time 10:03 AM  Treatment Type: Individual Therapy  Reported Symptoms: anxiety, sadness, triggered responses  Mental Status Exam:  Appearance:   Well Groomed     Behavior:  Appropriate  Motor:  Normal  Speech/Language:   Normal Rate  Affect:  Appropriate  Mood:  anxious  Thought process:  normal  Thought content:    WNL  Sensory/Perceptual disturbances:    WNL  Orientation:  oriented to person, place, time/date, and situation  Attention:  Good  Concentration:  Good  Memory:  WNL  Fund of knowledge:   Good  Insight:    Good  Judgment:   Good  Impulse Control:  Good   Risk Assessment: Danger to Self:  No Self-injurious Behavior: No Danger to Others: No Duty to Warn:no Physical Aggression / Violence:No  Access to Firearms a concern: No  Gang Involvement:No   Subjective: Patient was present for session. She shared that her ex is creating lots of issues still in different ways.  Patient reported she is able to manage the situation with her ex because she knows he is just doing what he normally does even though it is very stressful for her.  She got very triggered her boyfriend canceling on her for Father's Day when he got upset about a situation with his child.  Patient did processing set on him canceling suds level 8, negative cognition "other's feelings matter more than my needs" felt anxiety and sadness in her chest and stomach.  Patient was able to reduce suds level to 2.  She was able to realize that when he canceled it reminded her of how bad holidays were with her ex and with her father.  Discussed the importance of she communicating what holidays have been like for her in the past so they can develop a plan to make it better.  Also encouraged her to write down all the ways that she sees her boyfriend is very  positive and to remind herself of those things when she gets triggered.  Interventions: Solution-Oriented/Positive Psychology, Eye Movement Desensitization and Reprocessing (EMDR), and Insight-Oriented  Diagnosis:   ICD-10-CM   1. Generalized anxiety disorder  F41.1       Plan: Patient is to use CBT and coping skills to decrease anxiety symptoms.  Patient is to continue working on setting appropriate limits with her ex and her father and to remind herself that she is enough and she matters.  Patient is to continue releasing negative emotions through exercise.  Patient is to continue working on limit setting with her mother.  Patient is to write down the positive behaviors of her boyfriend as well as characteristics to read when she gets triggered by her ex or her father.  Patient is to affirm herself regularly.. Long-term goal: Resolve the core conflict that is a source of anxiety.  Decrease triggered responses that occur when having to deal with her ex-husband by 50% Short-term goal: Identify the major life complex in the past and present the form the basis for present anxiety    Stevphen Meuse, [facility]

## 2021-07-23 ENCOUNTER — Encounter: Payer: Self-pay | Admitting: Family Medicine

## 2021-07-25 ENCOUNTER — Ambulatory Visit: Payer: Commercial Managed Care - HMO | Admitting: Orthopaedic Surgery

## 2021-07-25 ENCOUNTER — Encounter: Payer: Self-pay | Admitting: Orthopaedic Surgery

## 2021-07-25 DIAGNOSIS — M5412 Radiculopathy, cervical region: Secondary | ICD-10-CM | POA: Diagnosis not present

## 2021-07-25 MED ORDER — GABAPENTIN 100 MG PO CAPS: 300.0000 mg | ORAL_CAPSULE | Freq: Every day | ORAL | 3 refills | Status: DC

## 2021-07-25 MED ORDER — NAPROXEN 500 MG PO TABS: 500.0000 mg | ORAL_TABLET | Freq: Two times a day (BID) | ORAL | 3 refills | Status: DC

## 2021-07-25 NOTE — Progress Notes (Signed)
Office Visit Note   Patient: Sabrina Nichols           Date of Birth: [birth year]           MRN: [id number] Visit Date: 07/25/2021              Requested by: Willow Ora, MD [address] [place],  [place] [address] PCP: Willow Ora, MD   Assessment & Plan: Visit Diagnoses:  1. Cervical radiculopathy     Plan: Impression is cervical radiculopathy likely left C5.  Treatment options were explained.  Patient would like to start with gabapentin and naproxen.  She declined steroids due to side effects.  Also talked about IM Toradol shot which may be an option if she does not feel any improvement.  She will let me know over the next couple weeks if she does not feel any improvement.  May need to consider MRI at that time.  Total face to face encounter time was greater than 25 minutes and over half of this time was spent in counseling and/or coordination of care.  Follow-Up Instructions: No follow-ups on file.   Orders:  No orders of the defined types were placed in this encounter.  Meds ordered this encounter  Medications   gabapentin (NEURONTIN) 100 MG capsule    Sig: Take 3 capsules (300 mg total) by mouth at bedtime.    Dispense:  30 capsule    Refill:  3   naproxen (NAPROSYN) 500 MG tablet    Sig: Take 1 tablet (500 mg total) by mouth 2 (two) times daily with a meal.    Dispense:  30 tablet    Refill:  3      Procedures: No procedures performed   Clinical Data: No additional findings.   Subjective: Chief Complaint  Patient presents with   Neck - Pain    HPI Patient is here for neck pain with left shoulder pain for 2 weeks.  Slept awkwardly 2 weeks ago at the beach with her kids.  Now has severe pain into the shoulder.  Denies any numbness and tingling.  Over-the-counter medications do not help. Review of Systems  Constitutional: Negative.   HENT: Negative.    Eyes: Negative.   Respiratory: Negative.    Cardiovascular: Negative.   Endocrine:  Negative.   Musculoskeletal: Negative.   Neurological: Negative.   Hematological: Negative.   Psychiatric/Behavioral: Negative.    All other systems reviewed and are negative.    Objective: Vital Signs: There were no vitals taken for this visit.  Physical Exam Vitals and nursing note reviewed.  Constitutional:      Appearance: She is well-developed.  Pulmonary:     Effort: Pulmonary effort is normal.  Skin:    General: Skin is warm.     Capillary Refill: Capillary refill takes less than 2 seconds.  Neurological:     Mental Status: She is alert and oriented to person, place, and time.  Psychiatric:        Behavior: Behavior normal.        Thought Content: Thought content normal.        Judgment: Judgment normal.     Ortho Exam Examination of the cervical spine and upper extremity shows slight weakness to shoulder abduction most likely due to pain.  Reflexes are normal.  Sensation normal.  Negative Spurling's and negative Lhermitte's. Specialty Comments:  No specialty comments available.  Imaging: No results found. X-rays from [facility] shows degenerative disc disease  at C4-5 C6 with loss of lordosis.  There is no acute abnormalities.  PMFS History: Patient Active Problem List   Diagnosis Date Noted   Abnormal cervical Papanicolaou smear 05/09/2021   H/O LEEP 05/09/2021   Rosacea 05/09/2021   Adjustment disorder with anxiety 05/09/2021   Acquired hypothyroidism 03/02/2021   Chronic pain of both knees 11/21/2017   Pes planus 09/16/2008   Past Medical History:  Diagnosis Date   Adjustment disorder with anxiety 05/09/2021   Started lexapro 2022; after divorce   Anxiety    Thyroid disease    Varicose veins    Vertigo     Family History  Problem Relation Age of Onset   Deep vein thrombosis Mother     Past Surgical History:  Procedure Laterality Date   CESAREAN SECTION  11/16/2007   laproscopic surgery to remove IUD  03/15/2008   LEEP     Social History    Occupational History   Not on file  Tobacco Use   Smoking status: Never   Smokeless tobacco: Never  Vaping Use   Vaping Use: Never used  Substance and Sexual Activity   Alcohol use: Yes    Comment: occ   Drug use: No   Sexual activity: Yes

## 2021-07-26 ENCOUNTER — Ambulatory Visit (INDEPENDENT_AMBULATORY_CARE_PROVIDER_SITE_OTHER): Payer: Commercial Managed Care - HMO | Admitting: Psychology

## 2021-07-26 DIAGNOSIS — F4322 Adjustment disorder with anxiety: Secondary | ICD-10-CM

## 2021-07-26 NOTE — Progress Notes (Signed)
[facility] Counselor Initial Adult Exam  Name: Sabrina Nichols Date: 08/10/2021 MRN: [id number] DOB: Oct 13, 1970 PCP: Sabrina Ora, MD  Time spent: 60 minutes  Guardian/Payee:  self    Paperwork requested: No   Reason for Visit /Presenting Problem: anxiety/stress associated with FOO  Mental Status Exam: Appearance:   Casual     Behavior:  Appropriate  Motor:  Normal  Speech/Language:   Normal Rate  Affect:  Appropriate  Mood:  anxious  Thought process:  normal  Thought content:    WNL  Sensory/Perceptual disturbances:    WNL  Orientation:  oriented to person, place, and situation  Attention:  Good  Concentration:  Good  Memory:  WNL  Fund of knowledge:   Good  Insight:    Good  Judgment:   Good  Impulse Control:  Good    Reported Symptoms:  agitation, worry, impaired family relationships  Risk Assessment: Danger to Self:  No Self-injurious Behavior: No Danger to Others: No Duty to Warn:no Physical Aggression / Violence:No  Access to Firearms a concern: No  Gang Involvement:No  Patient / guardian was educated about steps to take if suicide or homicide risk level increases between visits: n/a While future psychiatric events cannot be accurately predicted, the patient does not currently require acute inpatient psychiatric care and does not currently meet [facility] involuntary commitment criteria.  Substance Abuse History: Current substance abuse: No     Past Psychiatric History:   Previous psychological history is significant for marital discord Outpatient Providers:Sabrina Nichols, Ph.D. History of Psych Hospitalization: No  Psychological Testing:  N/A    Abuse History:  Victim of: No.,  N/A    Report needed: No. Victim of Neglect:No. Perpetrator of  N/A   Witness / Exposure to Domestic Violence: No   Protective Services Involvement: No  Witness to [employer] Violence:  No   Family History:  Family History  Problem Relation Age of  Onset   Deep vein thrombosis Mother     Living situation: the patient lives with their daughter  Sexual Orientation: Straight  Relationship Status: divorced  Name of spouse / other:ex spouse Sabrina Leriche If a parent, number of children / ages:2 children  Support Systems: N/A  Surveyor, quantity Stress:  No   Income/Employment/Disability: Employment  Financial planner: No   Educational History: Education: Risk manager: unknown  Any cultural differences that may affect / interfere with treatment:  not applicable   Recreation/Hobbies: unknown  Stressors: Marital or family conflict    Strengths: Supportive Relationships and Friends  Barriers:  unknown   Legal History: Pending legal issue / charges: The patient has no significant history of legal issues. History of legal issue / charges:  N/A  Medical History/Surgical History: not reviewed Past Medical History:  Diagnosis Date   Adjustment disorder with anxiety 05/09/2021   Started lexapro 2022; after divorce   Anxiety    Thyroid disease    Varicose veins    Vertigo     Past Surgical History:  Procedure Laterality Date   CESAREAN SECTION  11/16/2007   laproscopic surgery to remove IUD  03/15/2008   LEEP      Medications: Current Outpatient Medications  Medication Sig Dispense Refill   Cholecalciferol (VITAMIN D3) 250 MCG (10000 UT) capsule Take 10,000 Units by mouth daily.      escitalopram (LEXAPRO) 10 MG tablet 5 mg daily.     gabapentin (NEURONTIN) 100 MG capsule Take 3 capsules (300 mg total) by mouth at  bedtime. 30 capsule 3   naproxen (NAPROSYN) 500 MG tablet Take 1 tablet (500 mg total) by mouth 2 (two) times daily with a meal. 30 tablet 3   nitrofurantoin, macrocrystal-monohydrate, (MACROBID) 100 MG capsule Take 100 mg by mouth 2 (two) times daily. (Patient not taking: Reported on 08/08/2021)     NP THYROID 120 MG tablet Take 1 tablet (120 mg total) by mouth daily before breakfast.  90 tablet 1   progesterone (PROMETRIUM) 200 MG capsule      Testosterone Propionate (FIRST-TESTOSTERONE MC) 2 % CREA Place 5 mg onto the skin daily. 30 g 0   No current facility-administered medications for this visit.    Allergies  Allergen Reactions   Percocet [Oxycodone-Acetaminophen] Nausea And Vomiting  Goal of treatment is to reduce anxiety associated with dysfunctional family relationships. Patient will engage in conjoint family sessions as needed. Treat will focus on improving communication among family members and defining strategies to manage feels of stress and despair associated with the family discord Goal date is 12-23 Note: Patient attended session with 2 if her siblings, Sabrina Nichols and Sabrina Nichols. Eacxh expressed concern about their parents conflict and the possibility of them getting a divorce. This prospect is very disturbing for all. They acknowledge the pain of their mother, but question if divorce is the best solution. While acknowledging father's hostile and often inappropriate behavior, each expresses concern for his well-being if their parents split. WE discussed the fragmenting that is happening in the sibling relationships, with Sabrina Nichols becoming "mom's support system". This angers Sabrina Nichols and Sabrina Nichols. The talked about importance of maintaining appropriate boundaries and not getting in the middle of the parents conflict. Agreed to meet again as is necessary, depending on what occurs with their parents and the status of the sibling relationships.   Diagnoses:  Adjustment Disorder with Anxiety  Plan of Care: outpatient psychotherapy with family members as needed   Sabrina Ridgel, PhD 11:00a-12:00p 60 minutes.

## 2021-08-03 ENCOUNTER — Ambulatory Visit (INDEPENDENT_AMBULATORY_CARE_PROVIDER_SITE_OTHER): Payer: Commercial Managed Care - HMO | Admitting: Psychiatry

## 2021-08-03 DIAGNOSIS — F411 Generalized anxiety disorder: Secondary | ICD-10-CM

## 2021-08-03 NOTE — Progress Notes (Signed)
      [facility] Counselor/Therapist Progress Note  Patient ID: Sabrina Nichols, MRN: [id number],    Date: 08/03/2021  Time Spent: 50 minutes start time 9:07 AM end time 9:57 AM  Treatment Type: Individual Therapy  Reported Symptoms: anxiety, sadness, triggered responses, crying spells, health issues, pain, sleep issues  Mental Status Exam:  Appearance:   Casual and Neat     Behavior:  Appropriate  Motor:  Normal  Speech/Language:   Normal Rate  Affect:  Appropriate  Mood:  anxious  Thought process:  normal  Thought content:    WNL  Sensory/Perceptual disturbances:    WNL  Orientation:  oriented to person, place, time/date, and situation  Attention:  Good  Concentration:  Good  Memory:  WNL  Fund of knowledge:   Good  Insight:    Good  Judgment:   Good  Impulse Control:  Good   Risk Assessment: Danger to Self:  No Self-injurious Behavior: No Danger to Others: No Duty to Warn:no Physical Aggression / Violence:No  Access to Firearms a concern: No  Gang Involvement:No   Subjective: Patient was present for session. She shared that she is having lots of drama in her family.  She went on to share that has been hard for her. Patient had a pinched nerve that was very painful for her.  She stated that the pain was overwhelming that it caused her to not feel anything else.  The biggest issue she wanted to address was the concerns in her family.  She explained that they would be going to a family therapist and she needed to know what was realistic and appropriate for her to address.  Discussed the importance of her using this time to make sure that everybody is on the same page and moving in a good direction.  Interventions: {[phone]}  Diagnosis:   ICD-10-CM   1. Generalized anxiety disorder  F41.1       Plan: Patient is to use CBT and coping skills to decrease anxiety symptoms.  Patient is to continue working on setting appropriate limits with her ex and her father and to  remind herself that she is enough and she matters.  Patient is to continue releasing negative emotions through exercise.  Patient is to continue working on limit setting with her mother.  Patient is to write down the positive behaviors of her boyfriend as well as characteristics to read when she gets triggered by her ex or her father.  Patient is to affirm herself regularly.. Long-term goal: Resolve the core conflict that is a source of anxiety.  Decrease triggered responses that occur when having to deal with her ex-husband by 50% Short-term goal: Identify the major life complex in the past and present the form the basis for present anxiety  Stevphen Meuse, [facility]

## 2021-08-08 ENCOUNTER — Ambulatory Visit: Payer: Commercial Managed Care - HMO | Admitting: Family

## 2021-08-08 ENCOUNTER — Encounter: Payer: Self-pay | Admitting: Family

## 2021-08-08 VITALS — BP 123/80 | HR 62 | Temp 98.3°F | Ht 65.0 in | Wt 163.5 lb

## 2021-08-08 DIAGNOSIS — R350 Frequency of micturition: Secondary | ICD-10-CM | POA: Diagnosis not present

## 2021-08-08 LAB — POCT URINALYSIS DIPSTICK
Bilirubin, UA: NEGATIVE
Blood, UA: POSITIVE
Glucose, UA: NEGATIVE
Ketones, UA: NEGATIVE
Leukocytes, UA: NEGATIVE
Nitrite, UA: NEGATIVE
Protein, UA: NEGATIVE
Spec Grav, UA: 1.01 (ref 1.010–1.025)
Urobilinogen, UA: 0.2 E.U./dL
pH, UA: 7 (ref 5.0–8.0)

## 2021-08-08 NOTE — Progress Notes (Signed)
Patient ID: Sabrina Nichols, female    DOB: [birth year], 51 y.o.   MRN: [id number]  Chief Complaint  Patient presents with   Dysuria    Pt c/o dysuria, urinary frequency and urgency for about 4 days. Pt has been taking probiotics, drinking lots of water.    HPI: Urinary symptoms: Patient c/o  dysuria, frequency, urgency, cloudy urine.  Denies:  hematuria, pelvic, low back, or flank pain, no foul odor. Other sx: vaginal d/c: none, vaginal itching: none. Duration of sx: 4; Home tx: OTC Uqora; Denies  nausea, fever. Reports last UTI 7/9 w/ burning, urgency, drank cranberry juice, more water, severe flank pain & pelvic pressure x4h, low temp, chills, did televisit, took Macrobid x7d, finished 7/15.   Assessment & Plan:  1. Urinary frequency-  Treated w/Macrobid & finished 10d ago, sx restarted so she started OTC Uqora, UA negative, will send out for culture. Pt advised on UTI prevention.   - POCT Urinalysis Dipstick - Urine Culture   Subjective:    Outpatient Medications Prior to Visit  Medication Sig Dispense Refill   Cholecalciferol (VITAMIN D3) 250 MCG (10000 UT) capsule Take 10,000 Units by mouth daily.      escitalopram (LEXAPRO) 10 MG tablet 5 mg daily.     gabapentin (NEURONTIN) 100 MG capsule Take 3 capsules (300 mg total) by mouth at bedtime. 30 capsule 3   naproxen (NAPROSYN) 500 MG tablet Take 1 tablet (500 mg total) by mouth 2 (two) times daily with a meal. 30 tablet 3   NP THYROID 120 MG tablet Take 1 tablet (120 mg total) by mouth daily before breakfast. 90 tablet 1   progesterone (PROMETRIUM) 200 MG capsule      Testosterone Propionate (FIRST-TESTOSTERONE MC) 2 % CREA Place 5 mg onto the skin daily. 30 g 0   nitrofurantoin, macrocrystal-monohydrate, (MACROBID) 100 MG capsule Take 100 mg by mouth 2 (two) times daily. (Patient not taking: Reported on 08/08/2021)     No facility-administered medications prior to visit.   Past Medical History:  Diagnosis Date   Adjustment  disorder with anxiety 05/09/2021   Started lexapro 2022; after divorce   Anxiety    Thyroid disease    Varicose veins    Vertigo    Past Surgical History:  Procedure Laterality Date   CESAREAN SECTION  11/16/2007   laproscopic surgery to remove IUD  03/15/2008   LEEP     Allergies  Allergen Reactions   Percocet [Oxycodone-Acetaminophen] Nausea And Vomiting      Objective:    Physical Exam Vitals and nursing note reviewed.  Constitutional:      Appearance: Normal appearance.  Cardiovascular:     Rate and Rhythm: Normal rate and regular rhythm.  Pulmonary:     Effort: Pulmonary effort is normal.     Breath sounds: Normal breath sounds.  Musculoskeletal:        General: Normal range of motion.  Skin:    General: Skin is warm and dry.  Neurological:     Mental Status: She is alert.  Psychiatric:        Mood and Affect: Mood normal.        Behavior: Behavior normal.    BP 123/80 (BP Location: Left Arm, Patient Position: Sitting, Cuff Size: Large)   Pulse 62   Temp 98.3 F (36.8 C) (Temporal)   Ht 5\' 5"  (1.651 m)   Wt 163 lb 8 oz (74.2 kg)   LMP  (LMP Unknown)   SpO2  100%   BMI 27.21 kg/m  Wt Readings from Last 3 Encounters:  08/08/21 163 lb 8 oz (74.2 kg)  05/09/21 164 lb 6.4 oz (74.6 kg)  04/06/21 155 lb (70.3 kg)       Dulce Sellar, NP

## 2021-08-09 LAB — URINE CULTURE
MICRO NUMBER:: 13691473
Result:: NO GROWTH
SPECIMEN QUALITY:: ADEQUATE

## 2021-08-09 NOTE — Progress Notes (Signed)
Hi Sabrina Nichols,  Good news, no infection showed up with the culture.  Take care!

## 2021-08-16 ENCOUNTER — Ambulatory Visit: Payer: Managed Care, Other (non HMO) | Admitting: Family Medicine

## 2021-08-17 ENCOUNTER — Ambulatory Visit (INDEPENDENT_AMBULATORY_CARE_PROVIDER_SITE_OTHER): Payer: Commercial Managed Care - HMO | Admitting: Psychiatry

## 2021-08-17 DIAGNOSIS — F411 Generalized anxiety disorder: Secondary | ICD-10-CM

## 2021-08-17 NOTE — Progress Notes (Signed)
      [facility] Counselor/Therapist Progress Note  Patient ID: Frannie Shedrick, MRN: [id number],    Date: 08/17/2021  Time Spent: 55 minutes start time 10:01 AM end time 10:56 AM  Treatment Type: Individual Therapy  Reported Symptoms: anxiety, sadness, triggered responses  Mental Status Exam:  Appearance:   Casual and Neat     Behavior:  Appropriate  Motor:  Normal  Speech/Language:   Normal Rate  Affect:  Appropriate  Mood:  anxious and sad  Thought process:  normal  Thought content:    WNL  Sensory/Perceptual disturbances:    WNL  Orientation:  oriented to person, place, time/date, and situation  Attention:  Good  Concentration:  Good  Memory:  WNL  Fund of knowledge:   Good  Insight:    Good  Judgment:   Good  Impulse Control:  Good   Risk Assessment: Danger to Self:  No Self-injurious Behavior: No Danger to Others: No Duty to Warn:no Physical Aggression / Violence:No  Access to Firearms a concern: No  Gang Involvement:No   Subjective: Patient was present for session.  She shared that they had the family meeting and she shared how it went. She shared that their mother is not able to understand why she can't vent to them about their father. She shared that she doesn't know how to exist in the family. She shared that her boyfriend is going through an episode with his autism and he has disconnected which has been hard for her.  Patient did processing set on mom's judgmental look, suds level 8, negative cognition "I am selfish" felt anxiety in her chest.  Patient was able to reduce suds level to 3.  She was able to recognize that she has to disconnect from her mother at times but she can also engage with her.  Patient was also able to realize she needs to communicate with her boyfriend about her feelings but to recognize that she does not have to have one feeling or another she can have multiple feelings at the same time and that is okay.  Interventions: Eye Movement  Desensitization and Reprocessing (EMDR) and Insight-Oriented  Diagnosis:   ICD-10-CM   1. Generalized anxiety disorder  F41.1       Plan: Patient is to use CBT and coping skills to decrease anxiety symptoms.  Patient is to continue working on setting appropriate limits with her ex and her father and to remind herself that she is enough and she matters.  Patient is to continue releasing negative emotions through exercise.  Patient is to continue working on limit setting with her mother.  Patient is to write down the positive behaviors of her boyfriend as well as characteristics to read when she gets triggered by her ex or her father.  Patient is to affirm herself regularly.. Long-term goal: Resolve the core conflict that is a source of anxiety.  Decrease triggered responses that occur when having to deal with her ex-husband by 50% Short-term goal: Identify the major life complex in the past and present the form the basis for present anxiety    Stevphen Meuse, [facility]

## 2021-09-04 ENCOUNTER — Ambulatory Visit (INDEPENDENT_AMBULATORY_CARE_PROVIDER_SITE_OTHER): Payer: Commercial Managed Care - HMO | Admitting: Psychiatry

## 2021-09-04 DIAGNOSIS — F411 Generalized anxiety disorder: Secondary | ICD-10-CM | POA: Diagnosis not present

## 2021-09-04 NOTE — Progress Notes (Signed)
      [facility] Counselor/Therapist Progress Note  Patient ID: Sabrina Nichols, MRN: [id number],    Date: 09/04/2021  Time Spent: 48 minutes start time 12:12 PM end time 1:00 PM  Treatment Type: Individual Therapy  Reported Symptoms: anxiety, depression, triggered responses, rumination  Mental Status Exam:  Appearance:   Well Groomed     Behavior:  Appropriate  Motor:  Normal  Speech/Language:   Normal Rate  Affect:  Appropriate  Mood:  anxious  Thought process:  normal  Thought content:    WNL  Sensory/Perceptual disturbances:    WNL  Orientation:  oriented to person, place, time/date, and situation  Attention:  Good  Concentration:  Good  Memory:  WNL  Fund of knowledge:   Good  Insight:    Good  Judgment:   Good  Impulse Control:  Good   Risk Assessment: Danger to Self:  No Self-injurious Behavior: No Danger to Others: No Duty to Warn:no Physical Aggression / Violence:No  Access to Firearms a concern: No  Gang Involvement:No   Subjective: Patient was present for session.  She shared that she had her family session with her mother that was stressful for her. She shared that it was very intense but she was able to share some things she was able to share. She shared she is starting to feel some connection with her mother again.  Patient went on to explain that things have been difficult recently with her boyfriend since they have not been able to spend any time together over the summer and that is leading to her feeling disconnected.  She shared she got triggered when they went out for their date night over the weekend.  Did processing set on when he said "do not know last time you paid" suds level 8, negative cognition "I do not deserve it" felt fear in her throat and chest.  Patient was able to reduce suds level to 3.  Patient was able to recognize that she needs to communicate her feelings with her boyfriend and work through some of the situations.  Had patient think through  what and how she would communicate with him concerning how she feels and what she is needing from the relationship.  Interventions: Solution-Oriented/Positive Psychology and Eye Movement Desensitization and Reprocessing (EMDR)  Diagnosis:   ICD-10-CM   1. Generalized anxiety disorder  F41.1       Plan: Patient is to use CBT and coping skills to decrease anxiety symptoms.  Patient is to follow plans from session to communicate concerns and needs with her boyfriend appropriately.  Patient is to continue releasing negative emotions through exercise.  Patient is to continue working on limit setting with her mother.  Patient is to write down the positive behaviors of her boyfriend as well as characteristics to read when she gets triggered by her ex or her father.  Patient is to affirm herself regularly.. Long-term goal: Resolve the core conflict that is a source of anxiety.  Decrease triggered responses that occur when having to deal with her ex-husband by 50% Short-term goal: Identify the major life complex in the past and present the form the basis for present anxiety  Stevphen Meuse, [facility]

## 2021-09-07 DIAGNOSIS — M79645 Pain in left finger(s): Secondary | ICD-10-CM | POA: Insufficient documentation

## 2021-09-08 DIAGNOSIS — L608 Other nail disorders: Secondary | ICD-10-CM | POA: Insufficient documentation

## 2021-09-27 ENCOUNTER — Ambulatory Visit: Payer: Commercial Managed Care - HMO | Admitting: Psychiatry

## 2021-10-09 ENCOUNTER — Encounter: Payer: Self-pay | Admitting: *Deleted

## 2021-10-10 ENCOUNTER — Ambulatory Visit: Payer: Commercial Managed Care - HMO | Admitting: Sports Medicine

## 2021-10-10 DIAGNOSIS — M5030 Other cervical disc degeneration, unspecified cervical region: Secondary | ICD-10-CM | POA: Diagnosis not present

## 2021-10-10 NOTE — Assessment & Plan Note (Signed)
I gave her a series of isometric exercises for her neck Strengthening exercises for her deltoids and rotator cuff She needs to do a postural exercise series with I and T stretches She needs to work on posture and not allow the head forward position  Try these things over the next 6 weeks and then I would like to recheck her If symptom persist she may require some medication and of note in the past she did not respond to a low-dose of 300 mg of gabapentin at night

## 2021-10-10 NOTE — Progress Notes (Signed)
Chief complaint neck and bilateral shoulder pain  Over the past 4 to 5 months the patient has had more pain in both shoulders Sometimes a sharp pain that she is down near her shoulder blades Tightness in the muscles in this area  She had a bad flare several months ago with severe pain that made it hard for her to sleep or rest That pain extended down into her left arm  She had some chiropractic treatment She had plain x-rays that showed degenerative disc disease from the C5 level down to C7 level I was able to review these on her telephone  She saw Dr. Erlinda Hong who advised her about the degenerative changes and suggested continue conservative care for now  She would like to avoid medications when possible and uses some Advil for pain  Review of systems tingling and burning that sometimes goes into both hands  Sharp spasm found the neck and near the shoulder blades at times  Physical exam Pleasant female in no acute distress BP 120/70   Ht 5\' 5"  (1.651 m)   Wt 155 lb (70.3 kg)   BMI 25.79 kg/m  Neck has full range of motion She does get pain on neck extension She gets pain with full flexion Lateral bend and rotation seem okay Neurologic testing reveals no sensory deficit However there is significant weakness on abduction in the left arm at the shoulder And the right hand there is weakness on pinching the thumb and index finger Rotator cuff testing was otherwise unremarkable No atrophy was observed Normal scapular function was observed  Posture shows a head forward position with 5 to 7 degrees of forward flexion

## 2021-10-18 ENCOUNTER — Ambulatory Visit: Payer: Commercial Managed Care - HMO | Admitting: Psychiatry

## 2021-11-15 ENCOUNTER — Ambulatory Visit: Payer: Commercial Managed Care - HMO | Admitting: Psychiatry

## 2021-11-20 ENCOUNTER — Other Ambulatory Visit: Payer: Self-pay | Admitting: Orthopedic Surgery

## 2021-11-20 DIAGNOSIS — Z4789 Encounter for other orthopedic aftercare: Secondary | ICD-10-CM | POA: Insufficient documentation

## 2021-11-21 ENCOUNTER — Ambulatory Visit: Payer: Commercial Managed Care - HMO | Admitting: Sports Medicine

## 2021-12-04 ENCOUNTER — Other Ambulatory Visit: Payer: Self-pay

## 2021-12-04 DIAGNOSIS — M5412 Radiculopathy, cervical region: Secondary | ICD-10-CM

## 2021-12-11 ENCOUNTER — Telehealth: Payer: Self-pay | Admitting: Orthopaedic Surgery

## 2021-12-11 NOTE — Telephone Encounter (Signed)
Could you please resend referral to [facility]? Looks like you sent it on 12/06/2021.

## 2021-12-11 NOTE — Telephone Encounter (Signed)
Patient stated that Dr Roda Shutters was suppose to send her referral to [facility] and they did not receive it

## 2021-12-12 ENCOUNTER — Telehealth: Payer: Self-pay | Admitting: Orthopaedic Surgery

## 2021-12-12 NOTE — Telephone Encounter (Signed)
Received call from [facility] requesting the order for PT be faxed. I faxed [phone]

## 2021-12-13 NOTE — Telephone Encounter (Signed)
Refaxed order to [facility]

## 2021-12-28 ENCOUNTER — Encounter: Payer: Self-pay | Admitting: *Deleted

## 2022-02-02 DIAGNOSIS — E039 Hypothyroidism, unspecified: Secondary | ICD-10-CM | POA: Diagnosis not present

## 2022-02-02 DIAGNOSIS — Z130 Encounter for screening for diseases of the blood and blood-forming organs and certain disorders involving the immune mechanism: Secondary | ICD-10-CM | POA: Diagnosis not present

## 2022-02-02 DIAGNOSIS — Z13220 Encounter for screening for lipoid disorders: Secondary | ICD-10-CM | POA: Diagnosis not present

## 2022-02-02 DIAGNOSIS — Z13228 Encounter for screening for other metabolic disorders: Secondary | ICD-10-CM | POA: Diagnosis not present

## 2022-02-02 DIAGNOSIS — Z131 Encounter for screening for diabetes mellitus: Secondary | ICD-10-CM | POA: Diagnosis not present

## 2022-02-02 DIAGNOSIS — N951 Menopausal and female climacteric states: Secondary | ICD-10-CM | POA: Diagnosis not present

## 2022-02-15 DIAGNOSIS — M546 Pain in thoracic spine: Secondary | ICD-10-CM | POA: Diagnosis not present

## 2022-02-15 DIAGNOSIS — M25572 Pain in left ankle and joints of left foot: Secondary | ICD-10-CM | POA: Diagnosis not present

## 2022-02-15 DIAGNOSIS — M5412 Radiculopathy, cervical region: Secondary | ICD-10-CM | POA: Diagnosis not present

## 2022-02-15 DIAGNOSIS — M9906 Segmental and somatic dysfunction of lower extremity: Secondary | ICD-10-CM | POA: Diagnosis not present

## 2022-02-15 DIAGNOSIS — M9902 Segmental and somatic dysfunction of thoracic region: Secondary | ICD-10-CM | POA: Diagnosis not present

## 2022-02-15 DIAGNOSIS — M542 Cervicalgia: Secondary | ICD-10-CM | POA: Diagnosis not present

## 2022-02-15 DIAGNOSIS — M7918 Myalgia, other site: Secondary | ICD-10-CM | POA: Diagnosis not present

## 2022-02-15 DIAGNOSIS — M9901 Segmental and somatic dysfunction of cervical region: Secondary | ICD-10-CM | POA: Diagnosis not present

## 2022-02-20 DIAGNOSIS — Z124 Encounter for screening for malignant neoplasm of cervix: Secondary | ICD-10-CM | POA: Diagnosis not present

## 2022-02-20 DIAGNOSIS — R87611 Atypical squamous cells cannot exclude high grade squamous intraepithelial lesion on cytologic smear of cervix (ASC-H): Secondary | ICD-10-CM | POA: Insufficient documentation

## 2022-02-20 DIAGNOSIS — Z6827 Body mass index (BMI) 27.0-27.9, adult: Secondary | ICD-10-CM | POA: Diagnosis not present

## 2022-02-20 DIAGNOSIS — Z01419 Encounter for gynecological examination (general) (routine) without abnormal findings: Secondary | ICD-10-CM | POA: Diagnosis not present

## 2022-02-20 DIAGNOSIS — Z1231 Encounter for screening mammogram for malignant neoplasm of breast: Secondary | ICD-10-CM | POA: Diagnosis not present

## 2022-02-23 DIAGNOSIS — F411 Generalized anxiety disorder: Secondary | ICD-10-CM | POA: Diagnosis not present

## 2022-02-23 DIAGNOSIS — R69 Illness, unspecified: Secondary | ICD-10-CM | POA: Diagnosis not present

## 2022-03-01 DIAGNOSIS — R87611 Atypical squamous cells cannot exclude high grade squamous intraepithelial lesion on cytologic smear of cervix (ASC-H): Secondary | ICD-10-CM | POA: Diagnosis not present

## 2022-03-12 DIAGNOSIS — M5412 Radiculopathy, cervical region: Secondary | ICD-10-CM | POA: Diagnosis not present

## 2022-03-12 DIAGNOSIS — M9902 Segmental and somatic dysfunction of thoracic region: Secondary | ICD-10-CM | POA: Diagnosis not present

## 2022-03-12 DIAGNOSIS — M546 Pain in thoracic spine: Secondary | ICD-10-CM | POA: Diagnosis not present

## 2022-03-12 DIAGNOSIS — M7918 Myalgia, other site: Secondary | ICD-10-CM | POA: Diagnosis not present

## 2022-03-12 DIAGNOSIS — M9906 Segmental and somatic dysfunction of lower extremity: Secondary | ICD-10-CM | POA: Diagnosis not present

## 2022-03-12 DIAGNOSIS — M25572 Pain in left ankle and joints of left foot: Secondary | ICD-10-CM | POA: Diagnosis not present

## 2022-03-12 DIAGNOSIS — M9901 Segmental and somatic dysfunction of cervical region: Secondary | ICD-10-CM | POA: Diagnosis not present

## 2022-03-12 DIAGNOSIS — M542 Cervicalgia: Secondary | ICD-10-CM | POA: Diagnosis not present

## 2022-03-14 ENCOUNTER — Ambulatory Visit: Payer: Self-pay | Admitting: Psychiatry

## 2022-03-14 ENCOUNTER — Ambulatory Visit: Payer: Commercial Managed Care - HMO | Admitting: Psychiatry

## 2022-03-14 DIAGNOSIS — F411 Generalized anxiety disorder: Secondary | ICD-10-CM

## 2022-03-14 NOTE — Progress Notes (Signed)
[facility] Counselor/Therapist Progress Note  Patient ID: Sabrina Nichols, MRN: [id number],    Date: 03/14/2022  Time Spent: 55 minutes start time 2:05 PM end time 3 PM  Treatment Type: Individual Therapy  Reported Symptoms: anxiety, triggered responses, sadness, rumination, panic  Mental Status Exam:  Appearance:   Casual     Behavior:  Appropriate  Motor:  Normal  Speech/Language:   Normal Rate  Affect:  Appropriate  Mood:  anxious  Thought process:  normal  Thought content:    WNL  Sensory/Perceptual disturbances:    WNL  Orientation:  oriented to person, place, time/date, and situation  Attention:  Good  Concentration:  Good  Memory:  WNL  Fund of knowledge:   Good  Insight:    Good  Judgment:   Good  Impulse Control:  Good   Risk Assessment: Danger to Self:  No Self-injurious Behavior: No Danger to Others: No Duty to Warn:no Physical Aggression / Violence:No  Access to Firearms a concern: No  Gang Involvement:No   Subjective: Patient was present for session. She shared that her parents did separate and she had to finally forgave her parents and was able to move on with things. She is still with her boyfriend. She shared that he has had 2 shut downs and disconnects from patient completely. She shared that she is struggling with her reaction to him. She shared that she gets very dysregulated when it feels like he is rejecting her.  Patient had an abnormal pap smear and a biopsy which was inconclusive so now she is trying to figure out what to do next.  Patient shared that she is having more questions since she communicated with her physician.  Encouraged her to send her questions through her patient portal to her physician and not try to make a decision based on not having all her questions answered.  Patient was encouraged to think through what was going on with her emotional dysregulation that was so disturbing to her.  Patient shared the recent incident with her  boyfriend that was the most triggering.  As patient thought through the situation and was encouraged to think back to patterns that she has had with other relationships in the past including the relationship with her father she was able to recognize why she got so triggered and she gets so dysregulated.  Patient was encouraged to use that information to communicate with her boyfriend about what she needs and how the pattern can be stopped within their relationship.  Patient was given handouts on CBT and CBT for couples to share with him to help them think through the situation what childhood issue is triggered or modeled in that situation what their automatic negative thought is and their feelings and their behaviors so that as they are able to talk through that they can come up with a appropriate solution.  Interventions: Cognitive Behavioral Therapy, Solution-Oriented/Positive Psychology, and Insight-Oriented  Diagnosis:   ICD-10-CM   1. Generalized anxiety disorder  F41.1       Plan: Patient is to use CBT and coping skills to decrease anxiety symptoms.  Patient is to follow plans from session to discuss handouts and information from session with her boyfriend.  Patient is to continue releasing negative emotions through exercise.  Patient is to continue working on limit setting with her mother.  Patient is to write down the positive behaviors of her boyfriend as well as characteristics to read when she gets triggered by her  ex or her father.  Patient is to affirm herself regularly.. Long-term goal: Resolve the core conflict that is a source of anxiety.  Decrease triggered responses that occur when having to deal with her ex-husband by 50% Short-term goal: Identify the major life complex in the past and present the form the basis for present anxiety  Sabrina Nichols, [facility]

## 2022-03-19 DIAGNOSIS — M25572 Pain in left ankle and joints of left foot: Secondary | ICD-10-CM | POA: Diagnosis not present

## 2022-03-19 DIAGNOSIS — M9901 Segmental and somatic dysfunction of cervical region: Secondary | ICD-10-CM | POA: Diagnosis not present

## 2022-03-19 DIAGNOSIS — M7918 Myalgia, other site: Secondary | ICD-10-CM | POA: Diagnosis not present

## 2022-03-19 DIAGNOSIS — M9906 Segmental and somatic dysfunction of lower extremity: Secondary | ICD-10-CM | POA: Diagnosis not present

## 2022-03-19 DIAGNOSIS — M546 Pain in thoracic spine: Secondary | ICD-10-CM | POA: Diagnosis not present

## 2022-03-19 DIAGNOSIS — M542 Cervicalgia: Secondary | ICD-10-CM | POA: Diagnosis not present

## 2022-03-19 DIAGNOSIS — M5412 Radiculopathy, cervical region: Secondary | ICD-10-CM | POA: Diagnosis not present

## 2022-03-19 DIAGNOSIS — M9902 Segmental and somatic dysfunction of thoracic region: Secondary | ICD-10-CM | POA: Diagnosis not present

## 2022-03-20 ENCOUNTER — Ambulatory Visit (INDEPENDENT_AMBULATORY_CARE_PROVIDER_SITE_OTHER): Payer: 59 | Admitting: Psychiatry

## 2022-03-20 DIAGNOSIS — F411 Generalized anxiety disorder: Secondary | ICD-10-CM

## 2022-03-20 NOTE — Progress Notes (Signed)
      [facility] Counselor/Therapist Progress Note  Patient ID: Sabrina Nichols, MRN: [id number],    Date: 03/20/2022  Time Spent: 52 minutes start time 12:04 PM end time 12:56 PM  Treatment Type: Individual Therapy  Reported Symptoms: anxiety, sadness, triggered responses, rumination  Mental Status Exam:  Appearance:   Casual     Behavior:  Appropriate  Motor:  Normal  Speech/Language:   Normal Rate  Affect:  Appropriate  Mood:  sad  Thought process:  normal  Thought content:    WNL  Sensory/Perceptual disturbances:    WNL  Orientation:  oriented to person, place, time/date, and situation  Attention:  Good  Concentration:  Good  Memory:  WNL  Fund of knowledge:   Good  Insight:    Good  Judgment:   Good  Impulse Control:  Good   Risk Assessment: Danger to Self:  No Self-injurious Behavior: No Danger to Others: No Duty to Warn:no Physical Aggression / Violence:No  Access to Firearms a concern: No  Gang Involvement:No   Subjective: Patient was present for session. She shared she was able to talk with her boyfriend about what was discussed in last session. She shared that things are moving in a more positive direction.  Patient explained she is still feeling very frustrated because of the wall that she feels is there currently did processing set on the wall, said several 8, negative cognition "I cannot go through that hurt" felt sadness in her throat.  Patient was able to reduce thoughts level to 3.  She was able to recognize that she has gotten through multiple times of her and that if she is going to truly love her boyfriend she has to risk that hurt.  She was also able to recognize that some of what was surfacing was messages from others when she was with her ex that made her feel like it was her fault and that she was the problem.  Patient was able to recognize the truth regarding her relationship with her ex and the relationship she is in currently.  Patient was also  encouraged to recognize the way the 2 men are different and to remind herself of that truth.  Interventions: Solution-Oriented/Positive Psychology, Eye Movement Desensitization and Reprocessing (EMDR), and Insight-Oriented  Diagnosis:   ICD-10-CM   1. Generalized anxiety disorder  F41.1       Plan: Patient is to use CBT and coping skills to decrease anxiety symptoms.  Patient is to work on reminding herself of the facts and that her boyfriend is not like her ex and her dad..  Patient is to continue releasing negative emotions through exercise.  Patient is to continue working on limit setting with her mother.  Patient is to affirm herself regularly.. Long-term goal: Resolve the core conflict that is a source of anxiety.  Decrease triggered responses that occur when having to deal with her ex-husband by 50% Short-term goal: Identify the major life complex in the past and present the form the basis for present anxiety    Haylen Sayre, [facility]

## 2022-03-21 DIAGNOSIS — R87611 Atypical squamous cells cannot exclude high grade squamous intraepithelial lesion on cytologic smear of cervix (ASC-H): Secondary | ICD-10-CM | POA: Diagnosis not present

## 2022-03-21 DIAGNOSIS — F419 Anxiety disorder, unspecified: Secondary | ICD-10-CM | POA: Diagnosis not present

## 2022-03-26 DIAGNOSIS — M9901 Segmental and somatic dysfunction of cervical region: Secondary | ICD-10-CM | POA: Diagnosis not present

## 2022-03-26 DIAGNOSIS — M9906 Segmental and somatic dysfunction of lower extremity: Secondary | ICD-10-CM | POA: Diagnosis not present

## 2022-03-26 DIAGNOSIS — M546 Pain in thoracic spine: Secondary | ICD-10-CM | POA: Diagnosis not present

## 2022-03-26 DIAGNOSIS — M9902 Segmental and somatic dysfunction of thoracic region: Secondary | ICD-10-CM | POA: Diagnosis not present

## 2022-03-26 DIAGNOSIS — M5412 Radiculopathy, cervical region: Secondary | ICD-10-CM | POA: Diagnosis not present

## 2022-03-26 DIAGNOSIS — M542 Cervicalgia: Secondary | ICD-10-CM | POA: Diagnosis not present

## 2022-03-26 DIAGNOSIS — M25572 Pain in left ankle and joints of left foot: Secondary | ICD-10-CM | POA: Diagnosis not present

## 2022-03-26 DIAGNOSIS — M7918 Myalgia, other site: Secondary | ICD-10-CM | POA: Diagnosis not present

## 2022-03-27 ENCOUNTER — Ambulatory Visit (INDEPENDENT_AMBULATORY_CARE_PROVIDER_SITE_OTHER): Payer: 59 | Admitting: Psychiatry

## 2022-03-27 DIAGNOSIS — F411 Generalized anxiety disorder: Secondary | ICD-10-CM | POA: Diagnosis not present

## 2022-03-27 NOTE — Progress Notes (Signed)
[facility] Counselor/Therapist Progress Note  Patient ID: Jessly Stalzer, MRN: [id number],    Date: 03/27/2022  Time Spent: 56 minutes start time 8:06 AM end time 9:02 AM  Treatment Type: Individual Therapy  Reported Symptoms: anxiety, triggered responses, rumination, sadness  Mental Status Exam:  Appearance:   Well Groomed     Behavior:  Appropriate  Motor:  Normal  Speech/Language:   Normal Rate  Affect:  Appropriate  Mood:  anxious  Thought process:  normal  Thought content:    WNL  Sensory/Perceptual disturbances:    WNL  Orientation:  oriented to person, place, time/date, and situation  Attention:  Good  Concentration:  Good  Memory:  WNL  Fund of knowledge:   Good  Insight:    Good  Judgment:   Good  Impulse Control:  Good   Risk Assessment: Danger to Self:  No Self-injurious Behavior: No Danger to Others: No Duty to Warn:no Physical Aggression / Violence:No  Access to Firearms a concern: No  Gang Involvement:No   Subjective: Patient was present for session. She shared that her boyfriend is still very depressed.  She was able to acknowledge that work from last session has helped her recognize that she is not the issue so she is managing it more appropriately.  Patient went on to share she is struggling with how to have boundaries with somebody who is very depressed at the same time not let it impact her so that she can be who she needs to be for her kids.  Patient expressed concerns that he may have some suicidal ideation.  Helped patient think through options that she has and the importance of recognizing that he has a choice and even though she may want him to make certain decisions she has to allow him to do the journey that he needs so long as he is safe.  Patient is planning on attending a session with him and his therapist.  She shared she feels she needs to talk with him about how it is impacting her negatively and how she feels it is impacting his son.   Agreed that she needed to communicate those things with him and that the best time to do it may be when the therapist is there to help her navigate what she is expressing and also to help her let him know the impact of depression on other people.  Patient was encouraged to focus on taking care of herself and her kids right now rather than focusing energy into him when he is asking her not to.  Patient was able to recognize the trigger that she was feeling through the process.  Discussed some CBT skills to help her deal with that trigger appropriately.  Interventions: Cognitive Behavioral Therapy and Solution-Oriented/Positive Psychology  Diagnosis:   ICD-10-CM   1. Generalized anxiety disorder  F41.1       Plan:  Patient is to use CBT and coping skills to decrease anxiety symptoms.  Patient is to follow plans from session to focus on her self-care and caring for her daughters and giving her boyfriend the distance that he says he needs.  She is also to talk with her concerns and the impact it is having on her and his son that she is noticing when they are in session with his therapist.  Patient is to work on reminding herself of the facts and that her boyfriend is not like her ex and her dad..  Patient is to  continue releasing negative emotions through exercise.  Patient is to continue working on limit setting with her mother.  Patient is to affirm herself regularly.. Long-term goal: Resolve the core conflict that is a source of anxiety.  Decrease triggered responses that occur when having to deal with her ex-husband by 50% Short-term goal: Identify the major life complex in the past and present the form the basis for present anxiety  Zoeya Sayre, [facility]

## 2022-03-29 DIAGNOSIS — F4323 Adjustment disorder with mixed anxiety and depressed mood: Secondary | ICD-10-CM | POA: Diagnosis not present

## 2022-03-29 DIAGNOSIS — F411 Generalized anxiety disorder: Secondary | ICD-10-CM | POA: Diagnosis not present

## 2022-04-02 ENCOUNTER — Ambulatory Visit: Payer: 59 | Admitting: Psychiatry

## 2022-04-04 ENCOUNTER — Ambulatory Visit (INDEPENDENT_AMBULATORY_CARE_PROVIDER_SITE_OTHER): Payer: 59 | Admitting: Psychiatry

## 2022-04-04 DIAGNOSIS — F411 Generalized anxiety disorder: Secondary | ICD-10-CM | POA: Diagnosis not present

## 2022-04-04 NOTE — Progress Notes (Signed)
[facility] Counselor/Therapist Progress Note  Patient ID: Sabrina Nichols, MRN: [id number],    Date: 04/04/2022  Time Spent: 50 minutes start time  3:08 PM end time 3:58 PM  Treatment Type: Individual Therapy  Reported Symptoms: anxiety, triggered response, irritability, sadness  Mental Status Exam:  Appearance:   Normal Rate     Behavior:  Appropriate  Motor:  Normal  Speech/Language:   Normal Rate  Affect:  Appropriate  Mood:  anxious  Thought process:  normal  Thought content:    WNL  Sensory/Perceptual disturbances:    WNL  Orientation:  oriented to person, place, time/date, and situation  Attention:  Good  Concentration:  Good  Memory:  WNL  Fund of knowledge:   Good  Insight:    Good  Judgment:   Good  Impulse Control:  Good   Risk Assessment: Danger to Self:  No Self-injurious Behavior: No Danger to Others: No Duty to Warn:no Physical Aggression / Violence:No  Access to Firearms a concern: No  Gang Involvement:No   Subjective: Patient was present  for session. She shared that things were still stressful with her boyfriend and she decided that it was best to go back on her medication.  Patient was encouraged to feel good about making the decision she had to take care of herself to be who she wants to be with her kids especially.  Patient is going to have a meeting with boyfriend and his clinician next week.  Had patient think through what she wanted to work on and what she wanted to address at that time.  She explained that she is realizing she is pulling back from her boyfriend with the up and downs.  Encouraged her to recognize her choices are normal and that it would be important for him to show that she can trust that he is going to be committed and work on things before she will be able to put her walls down again.  Encouraged patient to focus on herself and her kids.  She shared that over the weekend he will have his son and she will be spending the time with  her daughters.  Encouraged her to find some very special things that she could do with them especially since she is feeling that she has not been able to be as present as she wants to be with them recently.  Patient reported feeling positive about plans from session and agreed to continue working on CBT skills to maintain perspective and deal with setting limits and being honest with her boyfriend.  Interventions: Solution-Oriented/Positive Psychology  Diagnosis:   ICD-10-CM   1. Generalized anxiety disorder  F41.1       Plan: Patient is to use CBT and coping skills to decrease anxiety symptoms.  Patient is to follow plans from session to focus on her self-care and caring for her daughters and giving her boyfriend the distance that he says he needs.  She is also to talk with her concerns and the impact it is having on her and his son that she is noticing when they are in session with his therapist.  Patient is to work on reminding herself of the facts and that her boyfriend is not like her ex and her dad..  Patient is to continue releasing negative emotions through exercise.  Patient is to continue working on limit setting with her mother.  Patient is to affirm herself regularly.. Long-term goal: Resolve the core conflict that is a source  of anxiety.  Decrease triggered responses that occur when having to deal with her ex-husband by 50% Short-term goal: Identify the major life complex in the past and present the form the basis for present anxiety  Sabrina Nichols, [facility]

## 2022-04-06 DIAGNOSIS — M542 Cervicalgia: Secondary | ICD-10-CM | POA: Diagnosis not present

## 2022-04-06 DIAGNOSIS — M9906 Segmental and somatic dysfunction of lower extremity: Secondary | ICD-10-CM | POA: Diagnosis not present

## 2022-04-06 DIAGNOSIS — M7918 Myalgia, other site: Secondary | ICD-10-CM | POA: Diagnosis not present

## 2022-04-06 DIAGNOSIS — M9902 Segmental and somatic dysfunction of thoracic region: Secondary | ICD-10-CM | POA: Diagnosis not present

## 2022-04-06 DIAGNOSIS — M25572 Pain in left ankle and joints of left foot: Secondary | ICD-10-CM | POA: Diagnosis not present

## 2022-04-06 DIAGNOSIS — M5412 Radiculopathy, cervical region: Secondary | ICD-10-CM | POA: Diagnosis not present

## 2022-04-06 DIAGNOSIS — M546 Pain in thoracic spine: Secondary | ICD-10-CM | POA: Diagnosis not present

## 2022-04-06 DIAGNOSIS — M9901 Segmental and somatic dysfunction of cervical region: Secondary | ICD-10-CM | POA: Diagnosis not present

## 2022-04-12 DIAGNOSIS — F4323 Adjustment disorder with mixed anxiety and depressed mood: Secondary | ICD-10-CM | POA: Diagnosis not present

## 2022-04-12 DIAGNOSIS — F411 Generalized anxiety disorder: Secondary | ICD-10-CM | POA: Diagnosis not present

## 2022-04-18 ENCOUNTER — Ambulatory Visit (INDEPENDENT_AMBULATORY_CARE_PROVIDER_SITE_OTHER): Payer: 59 | Admitting: Psychiatry

## 2022-04-18 DIAGNOSIS — F411 Generalized anxiety disorder: Secondary | ICD-10-CM

## 2022-04-18 NOTE — Progress Notes (Signed)
[facility] Counselor/Therapist Progress Note  Patient ID: Sabrina Nichols, MRN: [id number],    Date: 04/18/2022  Time Spent: 55 minutes start time 8:56 AM end time 9:51 AM  Treatment Type: Individual Therapy  Reported Symptoms: anxiety, triggered responses, sadness  Mental Status Exam:  Appearance:   Well Groomed     Behavior:  Appropriate  Motor:  Normal  Speech/Language:   Normal Rate  Affect:  Appropriate  Mood:  anxious  Thought process:  normal  Thought content:    WNL  Sensory/Perceptual disturbances:    WNL  Orientation:  oriented to person, place, time/date, and situation  Attention:  Good  Concentration:  Good  Memory:  WNL  Fund of knowledge:   Good  Insight:    Good  Judgment:   Good  Impulse Control:  Good   Risk Assessment: Danger to Self:  No Self-injurious Behavior: No Danger to Others: No Duty to Warn:no Physical Aggression / Violence:No  Access to Firearms a concern: No  Gang Involvement:No   Subjective: Patient was present for session. They did go to meet with her boyfriend's therapist and he was able to recognize his role in things. She stated that he started taking his medications again.  Patient stated she is trying to figure out what is normal regarding her emotions currently.  She is having a disconnect from her boyfriend even with the reconciliation.  Patient was encouraged to realize that after 6 to 8 weeks of him being up and down and disconnected from her it is not realistic for things to go back to normal after 2 weeks.  She was encouraged to be honest with him and not feel pressured to have to be in a certain way.  Discussed what may be normal and how she can affirm herself anger and have grace with herself regarding how she feels about her boyfriend.  Also encouraged her to realize that not only did he disconnect from her but her children and so that puts things in a different perspective and for things to be completely reconciled he probably  is going to need to apologize to them and reassuring them that his behavior is going to be appropriate in the future.  Patient was able to realize that she is going to be okay regardless of whether or not the relationship continues or does not play and that through practicing her honesty with him it will help her in this relationship or future relationships the importance of maintaining perspective was addressed with patient.  Patient also shared she had another incident with her ex and they are going to have to go to court due to him being in contempt of their custody agreement.  Patient shared that brings a lot of anxiety for her just because of going to court.  She was encouraged to realize that she did nothing wrong and her attorney would not be going to court if she had missed read or misunderstood the custody agreement in any way.  Different strategies to keep herself grounded during the process were discussed with patient.  Patient was also encouraged to start doing some deep breathing and thinking of things she is thankful for every hour.  Interventions: Solution-Oriented/Positive Psychology  Diagnosis:   ICD-10-CM   1. Generalized anxiety disorder  F41.1       Plan: Patient is to use CBT and coping skills to decrease anxiety symptoms.  Patient is to follow plans from session to try and take deep breaths  and think of things that she is thankful for every hour.  Patient is to focus on self-care and taking care of her daughters.  Patient is to use self talk if she needs to attend court and remind herself that her ex husband is in the one in contempt.  Patient is to work on reminding herself of the facts and that her boyfriend is not like her ex and her dad..  Patient is to continue releasing negative emotions through exercise.  Patient is to continue working on limit setting with her mother.  Patient is to affirm herself regularly.. Long-term goal: Resolve the core conflict that is a source of  anxiety.  Decrease triggered responses that occur when having to deal with her ex-husband by 50% Short-term goal: Identify the major life complex in the past and present the form the basis for present anxiety    Sabrina Nichols, [facility]

## 2022-04-20 DIAGNOSIS — M9901 Segmental and somatic dysfunction of cervical region: Secondary | ICD-10-CM | POA: Diagnosis not present

## 2022-04-20 DIAGNOSIS — M9906 Segmental and somatic dysfunction of lower extremity: Secondary | ICD-10-CM | POA: Diagnosis not present

## 2022-04-20 DIAGNOSIS — M542 Cervicalgia: Secondary | ICD-10-CM | POA: Diagnosis not present

## 2022-04-20 DIAGNOSIS — M7918 Myalgia, other site: Secondary | ICD-10-CM | POA: Diagnosis not present

## 2022-04-20 DIAGNOSIS — M25572 Pain in left ankle and joints of left foot: Secondary | ICD-10-CM | POA: Diagnosis not present

## 2022-04-20 DIAGNOSIS — M546 Pain in thoracic spine: Secondary | ICD-10-CM | POA: Diagnosis not present

## 2022-04-20 DIAGNOSIS — M5412 Radiculopathy, cervical region: Secondary | ICD-10-CM | POA: Diagnosis not present

## 2022-04-20 DIAGNOSIS — M9902 Segmental and somatic dysfunction of thoracic region: Secondary | ICD-10-CM | POA: Diagnosis not present

## 2022-04-24 DIAGNOSIS — F4323 Adjustment disorder with mixed anxiety and depressed mood: Secondary | ICD-10-CM | POA: Diagnosis not present

## 2022-04-24 DIAGNOSIS — F411 Generalized anxiety disorder: Secondary | ICD-10-CM | POA: Diagnosis not present

## 2022-04-25 ENCOUNTER — Ambulatory Visit (INDEPENDENT_AMBULATORY_CARE_PROVIDER_SITE_OTHER): Payer: 59 | Admitting: Psychiatry

## 2022-04-25 DIAGNOSIS — F411 Generalized anxiety disorder: Secondary | ICD-10-CM | POA: Diagnosis not present

## 2022-04-25 NOTE — Progress Notes (Signed)
[facility] Counselor/Therapist Progress Note  Patient ID: Sabrina Nichols, MRN: [id number],    Date: 04/25/2022  Time Spent: 54 minutes start time 9:02 AM end time 9:56 AM  Treatment Type: Individual Therapy  Reported Symptoms: anxiety, triggered responses, sadness, rumination  Mental Status Exam:  Appearance:   Casual     Behavior:  Appropriate  Motor:  Normal  Speech/Language:   Normal Rate  Affect:  Appropriate  Mood:  anxious  Thought process:  normal  Thought content:    WNL  Sensory/Perceptual disturbances:    WNL  Orientation:  oriented to person, place, time/date, and situation  Attention:  Good  Concentration:  Good  Memory:  WNL  Fund of knowledge:   Good  Insight:    Good  Judgment:   Good  Impulse Control:  Good   Risk Assessment: Danger to Self:  No Self-injurious Behavior: No Danger to Others: No Duty to Warn:no Physical Aggression / Violence:No  Access to Firearms a concern: No  Gang Involvement:No   Subjective: Patient was present for session. She shared that things are stressful with the custody issues with her ex and pending court date. She shared her daughters have called him and it was upsetting for her. She shared she feels that he recorded the conversations to try and use in court if there is anything that he can manipulate. She was able to acknowledge that being triggered with her husband was probably impacting some of her current relationship.  She explained that they had met with a therapist again and she had been able to be more upfront and honest about feelings and what she needs and initially it had gone well but then she got a text from her boyfriend that reminded her of things from her ex.  Had patient think through all the negative messages that she had left over from her relationship with her ex-husband and discussed the importance of sharing that information with her boyfriend so that he knows when he does certain things is good to trigger  a certain response.  Patient was also encouraged to ask him some questions about his triggers so that they can recognize those things in each other and then make a different choice of how to interact together.  Patient was also able to think through things that she needed to say to her boyfriend and things that she wanted to hear concerning what he wanted to share ways to work on listening to each other better and making sure that they were hearing what the other person was truly saying was discussed with patient.  Interventions: Cognitive Behavioral Therapy and Solution-Oriented/Positive Psychology  Diagnosis:   ICD-10-CM   1. Generalized anxiety disorder  F41.1       Plan:  Patient is to use CBT and coping skills to decrease anxiety symptoms.  Patient is to follow plans from session to talk with her boyfriend about the negative messages she got from her ex and how that can impact their relationship as well as talk to him about what he got from his [employer] and ways that they can communicate differently and make sure they are both on the same page.  Patient is to focus on self-care and taking care of her daughters.  Patient is to use self talk if she needs to attend court and remind herself that her ex husband is in the one in contempt.  Patient is to work on reminding herself of the facts and that her  boyfriend is not like her ex and her dad..  Patient is to continue releasing negative emotions through exercise.  Patient is to continue working on limit setting with her mother.  Patient is to affirm herself regularly.. Long-term goal: Resolve the core conflict that is a source of anxiety.  Decrease triggered responses that occur when having to deal with her ex-husband by 50% Short-term goal: Identify the major life complex in the past and present the form the basis for present anxiety  Stevphen Meuse, [facility]

## 2022-05-01 ENCOUNTER — Telehealth: Payer: Self-pay | Admitting: Psychiatry

## 2022-05-01 ENCOUNTER — Ambulatory Visit (INDEPENDENT_AMBULATORY_CARE_PROVIDER_SITE_OTHER): Payer: 59 | Admitting: Psychiatry

## 2022-05-01 DIAGNOSIS — F411 Generalized anxiety disorder: Secondary | ICD-10-CM

## 2022-05-01 NOTE — Progress Notes (Unsigned)
[facility] Counselor/Therapist Progress Note  Patient ID: Sabrina Nichols, MRN: [id number],    Date: 05/01/2022  Time Spent: 52 minutes start time 12:58 end time 1:50 PM Virtual Visit via Video Note Connected with patient by a telemedicine/telehealth application, with their informed consent, and verified patient privacy and that I am speaking with the correct person using two identifiers. I discussed the limitations, risks, security and privacy concerns of performing psychotherapy and the availability of in person appointments. I also discussed with the patient that there may be a patient responsible charge related to this service. The patient expressed understanding and agreed to proceed. I discussed the treatment planning with the patient. The patient was provided an opportunity to ask questions and all were answered. The patient agreed with the plan and demonstrated an understanding of the instructions. The patient was advised to call  our office if  symptoms worsen or feel they are in a crisis state and need immediate contact.   Therapist Location:home Patient Location: home    Treatment Type: Individual Therapy  Reported Symptoms: anxiety, sadness, anger, triggered responses, rumination  Mental Status Exam:  Appearance:   Well Groomed     Behavior:  Appropriate  Motor:  Normal  Speech/Language:   Normal Rate  Affect:  Appropriate and Tearful  Mood:  anxious and sad  Thought process:  normal  Thought content:    WNL  Sensory/Perceptual disturbances:    WNL  Orientation:  oriented to person, place, time/date, and situation  Attention:  Good  Concentration:  Good  Memory:  WNL  Fund of knowledge:   Good  Insight:    Good  Judgment:   Good  Impulse Control:  Good   Risk Assessment: Danger to Self:  No Self-injurious Behavior: No Danger to Others: No Duty to Warn:no Physical Aggression / Violence:No  Access to Firearms a concern: No  Gang Involvement:No    Subjective: Met with patient via virtual session. She shared that after last session  things progressed poorly with her boyfriend and she finally ended the relationship. She shared that she felt the processing in the last session had helped her because she was able to realize what she wanted and didn't want. She discussed the situation and was given time to release her emotions  She was encouraged to recognize it is another loss/grief for her and she needs to give herself some time to deal with it. She is to set the timer for 20 minutes and let herself cry and than go and do something to help her get to different place.Marland Kitchen  Marland KitchenShe also explained that her ex continued to be difficult until he had an attorney tell him that he had to follow the court order.  She was able to recognize that made her mood difficult as well.  Patient was able to acknowledge how the two men were similar and how she is making progress but still having a hard time in relationships.  Encouraged to to write out the things she can live with and what she can't and to remind herself that she is enough.  Patient is to focus on doing things with her children and helping them see how to work through feelings after a break up.     Interventions: Cognitive Behavioral Therapy and Solution-Oriented/Positive Psychology  Diagnosis:   ICD-10-CM   1. Generalized anxiety disorder  F41.1       Plan:   Patient is to use CBT and coping skills to decrease  anxiety symptoms.  Patient is to follow plans from session to focus on reminding herself of what she wants in a relatinship.  Patient is to focus on self-care and taking care of her daughters.  Patient is to use self talk if she needs to attend court and remind herself that her ex husband is in the one in contempt.  Patient is to work on reminding herself of the facts and that her boyfriend is not like her ex and her dad..  Patient is to continue releasing negative emotions through exercise.  Patient  is to continue working on limit setting with her mother.  Patient is to affirm herself regularly.. Long-term goal: Resolve the core conflict that is a source of anxiety.  Decrease triggered responses that occur when having to deal with her ex-husband by 50% Short-term goal: Identify the major life complex in the past and present the form the basis for present anxiety  Stevphen Meuse, [facility]

## 2022-05-01 NOTE — Telephone Encounter (Signed)
Ms. Sabrina Nichols, Sabrina Nichols are scheduled for a virtual visit with your provider today.    Just as we do with appointments in the office, we must obtain your consent to participate.  Your consent will be active for this visit and any virtual visit you may have with one of our providers in the next 365 days.    If you have a MyChart account, I can also send a copy of this consent to you electronically.  All virtual visits are billed to your insurance company just like a traditional visit in the office.  As this is a virtual visit, video technology does not allow for your provider to perform a traditional examination.  This may limit your provider's ability to fully assess your condition.  If your provider identifies any concerns that need to be evaluated in person or the need to arrange testing such as labs, EKG, etc, we will make arrangements to do so.    Although advances in technology are sophisticated, we cannot ensure that it will always work on either your end or our end.  If the connection with a video visit is poor, we may have to switch to a telephone visit.  With either a video or telephone visit, we are not always able to ensure that we have a secure connection.   I need to obtain your verbal consent now.   Are you willing to proceed with your visit today?   [name] has provided verbal consent on 05/01/2022 for a virtual visit (video or telephone).   Stevphen Meuse, [facility] 05/01/2022  12:59 PM

## 2022-05-04 DIAGNOSIS — M9901 Segmental and somatic dysfunction of cervical region: Secondary | ICD-10-CM | POA: Diagnosis not present

## 2022-05-04 DIAGNOSIS — M5412 Radiculopathy, cervical region: Secondary | ICD-10-CM | POA: Diagnosis not present

## 2022-05-04 DIAGNOSIS — M542 Cervicalgia: Secondary | ICD-10-CM | POA: Diagnosis not present

## 2022-05-04 DIAGNOSIS — M546 Pain in thoracic spine: Secondary | ICD-10-CM | POA: Diagnosis not present

## 2022-05-04 DIAGNOSIS — M9902 Segmental and somatic dysfunction of thoracic region: Secondary | ICD-10-CM | POA: Diagnosis not present

## 2022-05-04 DIAGNOSIS — M9906 Segmental and somatic dysfunction of lower extremity: Secondary | ICD-10-CM | POA: Diagnosis not present

## 2022-05-04 DIAGNOSIS — M7918 Myalgia, other site: Secondary | ICD-10-CM | POA: Diagnosis not present

## 2022-05-04 DIAGNOSIS — M25572 Pain in left ankle and joints of left foot: Secondary | ICD-10-CM | POA: Diagnosis not present

## 2022-05-08 ENCOUNTER — Ambulatory Visit (INDEPENDENT_AMBULATORY_CARE_PROVIDER_SITE_OTHER): Payer: 59 | Admitting: Psychiatry

## 2022-05-08 DIAGNOSIS — F411 Generalized anxiety disorder: Secondary | ICD-10-CM

## 2022-05-08 NOTE — Progress Notes (Unsigned)
[facility] Counselor/Therapist Progress Note  Patient ID: Sabrina Nichols, MRN: [id number],    Date: 05/08/2022  Time Spent: 48 minutes start time 12:03 PM end time 12:51 PM Virtual Visit via Video Note Connected with patient by a telemedicine/telehealth application, with their informed consent, and verified patient privacy and that I am speaking with the correct person using two identifiers. I discussed the limitations, risks, security and privacy concerns of performing psychotherapy and the availability of in person appointments. I also discussed with the patient that there may be a patient responsible charge related to this service. The patient expressed understanding and agreed to proceed. I discussed the treatment planning with the patient. The patient was provided an opportunity to ask questions and all were answered. The patient agreed with the plan and demonstrated an understanding of the instructions. The patient was advised to call  our office if  symptoms worsen or feel they are in a crisis state and need immediate contact.   Therapist Location: home Patient Location: home    Treatment Type: Individual Therapy  Reported Symptoms: anxiety, sadness, rumination  Mental Status Exam:  Appearance:   Well Groomed     Behavior:  Appropriate  Motor:  Normal  Speech/Language:   Normal Rate  Affect:  Appropriate  Mood:  anxious  Thought process:  normal  Thought content:    WNL  Sensory/Perceptual disturbances:    WNL  Orientation:  oriented to person, place, time/date, and situation  Attention:  Good  Concentration:  Good  Memory:  WNL  Fund of knowledge:   Good  Insight:    Good  Judgment:   Good  Impulse Control:  Good   Risk Assessment: Danger to Self:  No Self-injurious Behavior: No Danger to Others: No Duty to Warn:no Physical Aggression / Violence:No  Access to Firearms a concern: No  Gang Involvement:No   Subjective: Met with patient via virtual session.   She shared that her ex-boyfriend  contacted her.  Patient went on to explain the conversation and what he shared.  Patient went on to explain that he is going to write letters to her daughters and bring him over later in the afternoon.  Patient stated that they have been texting some.  Discussed the fact that it is difficult for her to let go of the relationship if they are interacting.  Patient was able to recognize that a part of her still wanted the relationship.  Discussed how that would be normal and the importance of not feeling the need to put pressure on herself to make a decision either way until she felt peace about the decision.  Patient shared she feels that she needs to take some sort of break from him discussed different options that she decided she needed to let him know that there needs to be no contact for 2 weeks and during that time she is to use what ever surfaces as information on whether or not she wants to continue or end the relationship.  Patient was encouraged not to let other people's voices in her head but to make a decision based on what she felt was best for her and her family at this time.  Ways to help her talk herself through that were discussed in session.  Interventions: Cognitive Behavioral Therapy and Solution-Oriented/Positive Psychology  Diagnosis:   ICD-10-CM   1. Generalized anxiety disorder  F41.1       Plan:   Patient is to use CBT and coping  skills to decrease anxiety symptoms.  Patient is to follow plans from session to f take a 2-week break of no contact from her ex-boyfriend to see if she wants to continue in the relationship or not.  Patient is to focus on self-care and taking care of her daughters.  Patient is to use self talk if she needs to attend court and remind herself that her ex husband is in the one in contempt.  Patient is to work on reminding herself of the facts and that her boyfriend is not like her ex and her dad..  Patient is to continue  releasing negative emotions through exercise.  Patient is to continue working on limit setting with her mother.  Patient is to affirm herself regularly.. Long-term goal: Resolve the core conflict that is a source of anxiety.  Decrease triggered responses that occur when having to deal with her ex-husband by 50% Short-term goal: Identify the major life complex in the past and present the form the basis for present anxiety  Stevphen Meuse, [facility]

## 2022-05-14 ENCOUNTER — Ambulatory Visit: Payer: Self-pay | Admitting: Psychiatry

## 2022-05-17 DIAGNOSIS — F411 Generalized anxiety disorder: Secondary | ICD-10-CM | POA: Diagnosis not present

## 2022-05-17 DIAGNOSIS — F4323 Adjustment disorder with mixed anxiety and depressed mood: Secondary | ICD-10-CM | POA: Diagnosis not present

## 2022-05-21 ENCOUNTER — Ambulatory Visit (INDEPENDENT_AMBULATORY_CARE_PROVIDER_SITE_OTHER): Payer: 59 | Admitting: Psychiatry

## 2022-05-21 DIAGNOSIS — F411 Generalized anxiety disorder: Secondary | ICD-10-CM | POA: Diagnosis not present

## 2022-05-21 NOTE — Progress Notes (Signed)
[facility] Counselor/Therapist Progress Note  Patient ID: Sabrina Nichols, MRN: [id number],    Date: 05/21/2022  Time Spent: 59 minutes start time 1:00 PM end time 1:59 PM Virtual Visit via Video Note Connected with patient by a telemedicine/telehealth application, with their informed consent, and verified patient privacy and that I am speaking with the correct person using two identifiers. I discussed the limitations, risks, security and privacy concerns of performing psychotherapy and the availability of in person appointments. I also discussed with the patient that there may be a patient responsible charge related to this service. The patient expressed understanding and agreed to proceed. I discussed the treatment planning with the patient. The patient was provided an opportunity to ask questions and all were answered. The patient agreed with the plan and demonstrated an understanding of the instructions. The patient was advised to call  our office if  symptoms worsen or feel they are in a crisis state and need immediate contact.   Therapist Location: home Patient Location: home    Treatment Type: Individual Therapy  Reported Symptoms: anxiety, sadness, triggered responses, rumination  Mental Status Exam:  Appearance:   Well Groomed     Behavior:  Appropriate  Motor:  Normal  Speech/Language:   Normal Rate  Affect:  Appropriate tearful  Mood:  anxious  Thought process:  normal  Thought content:    WNL  Sensory/Perceptual disturbances:    WNL  Orientation:  oriented to person, place, time/date, and situation  Attention:  Good  Concentration:  Good  Memory:  WNL  Fund of knowledge:   Good  Insight:    Good  Judgment:   Good  Impulse Control:  Good   Risk Assessment: Danger to Self:  No Self-injurious Behavior: No Danger to Others: No Duty to Warn:no Physical Aggression / Violence:No  Access to Firearms a concern: No  Gang Involvement:No   Subjective: Met with  patient via virtual session. She shared that her boyfriend had given the letters to the girls and it was interesting for her. She shared that she was able to confront him on his disconnecting behavior and he was able to share that he is working on the issues. She shared that they are spending some time together She went on to share she was wishing that she had stuck to the plan of getting 2 weeks without communication. She went on to share she is feeling emotionally numb.She went on to share that she is recognizing everything feels heavy and that is hard for her. She explained she is struggling with the ups and downs of things. Did processing set on the issue, SUDS level 8 negative cognition "I'm unstable" felt fear in her throat.  Patient was able to reduce SUDS level to 4.  Through the processing she was able to recognize she just can't turn off her feelings for her boyfriend without turning off feelings for everyone, so she is going to have to keep dealing with the emotions and take time for her own self care.  She is .recognizing that she is taking care of her children, her sister, her parents, and working so she gets overwhelmed with making decisions and being together for so many people so it is okay for her to take time alone to re energize.  Discussed how she can talk to her daughters about her feelings without them feeling it is their fault.  She also asked questions about mindfulness discussed how to get to wise mind with  patient.  Interventions: Dialectical Behavioral Therapy, Solution-Oriented/Positive Psychology, Eye Movement Desensitization and Reprocessing (EMDR), and Insight-Oriented  Diagnosis:   ICD-10-CM   1. Generalized anxiety disorder  F41.1       Plan:  Patient is to use CBT and coping skills to decrease anxiety symptoms.  Patient is to follow plans take time for herself to re energize and practice wise mind.  Patient is to focus on self-care and taking care of her daughters.   Patient is to use self talk if she needs to attend court and remind herself that her ex husband is in the one in contempt.  Patient is to work on reminding herself of the facts and that her boyfriend is not like her ex and her dad..  Patient is to continue releasing negative emotions through exercise.  Patient is to continue working on limit setting with her mother.  Patient is to affirm herself regularly.. Long-term goal: Resolve the core conflict that is a source of anxiety.  Decrease triggered responses that occur when having to deal with her ex-husband by 50% Short-term goal: Identify the major life complex in the past and present the form the basis for present anxiety  Stevphen Meuse, [facility]

## 2022-05-24 DIAGNOSIS — F4323 Adjustment disorder with mixed anxiety and depressed mood: Secondary | ICD-10-CM | POA: Diagnosis not present

## 2022-05-24 DIAGNOSIS — F411 Generalized anxiety disorder: Secondary | ICD-10-CM | POA: Diagnosis not present

## 2022-05-28 ENCOUNTER — Ambulatory Visit (INDEPENDENT_AMBULATORY_CARE_PROVIDER_SITE_OTHER): Payer: 59 | Admitting: Psychiatry

## 2022-05-28 DIAGNOSIS — F411 Generalized anxiety disorder: Secondary | ICD-10-CM

## 2022-05-28 NOTE — Progress Notes (Signed)
[facility] Counselor/Therapist Progress Note  Patient ID: Sabrina Nichols, MRN: [id number],    Date: 05/28/2022  Time Spent: 59 minutes 8:59 AM end time 9:58 AM Virtual Visit via Video Note Connected with patient by a telemedicine/telehealth application, with their informed consent, and verified patient privacy and that I am speaking with the correct person using two identifiers. I discussed the limitations, risks, security and privacy concerns of performing psychotherapy and the availability of in person appointments. I also discussed with the patient that there may be a patient responsible charge related to this service. The patient expressed understanding and agreed to proceed. I discussed the treatment planning with the patient. The patient was provided an opportunity to ask questions and all were answered. The patient agreed with the plan and demonstrated an understanding of the instructions. The patient was advised to call  our office if  symptoms worsen or feel they are in a crisis state and need immediate contact.   Therapist Location: home Patient Location: home    Treatment Type: Individual Therapy  Reported Symptoms: anxiety, irritability, triggered response, sadness, rumination  Mental Status Exam:  Appearance:   Well Groomed     Behavior:  Appropriate  Motor:  Normal  Speech/Language:   Normal Rate  Affect:  Appropriate  Mood:  anxious  Thought process:  normal  Thought content:    WNL  Sensory/Perceptual disturbances:    WNL  Orientation:  oriented to person, place, time/date, and situation  Attention:  Good  Concentration:  Good  Memory:  WNL  Fund of knowledge:   Good  Insight:    Good  Judgment:   Good  Impulse Control:  Good   Risk Assessment: Danger to Self:  No Self-injurious Behavior: No Danger to Others: No Duty to Warn:no Physical Aggression / Violence:No  Access to Firearms a concern: No  Gang Involvement:No   Subjective: Met with patient via  virtual session. She shared that she was feeling anxious due to feeling that she has not been as patient as she wants to with her children.  She explained she has been trying to use her mindfulness skills but felt that they were still not helping her in the moment.  Had patient think through what she felt was her triggered moments.  Discussed ways to manage the situations differently to try and decrease the issues.  She was encouraged to talk with her daughters about the different issues and to work on solutions with them.  Encouraged her to recognize what a great job she is doing with her girls especially with all of the issues that they are going through currently, asked her to have grace with herself and realize perfection is not an option.Patient also discussed how things are going with her boyfriend and her concerns about discussing it with her friends. Reminded her that she is the one that lives with her choices so it is okay to do what she needs to for her and let what others think go. Patient is to continue focusing on taking things 1 step at a time and know she will get where she needs to go and her girls will be okay.   Interventions: Solution-Oriented/Positive Psychology  Diagnosis:   ICD-10-CM   1. Generalized anxiety disorder  F41.1       Plan: Patient is to use CBT and coping skills to decrease anxiety symptoms.  Patient is to follow plans think through solutions to the different situations with her daughters.  Patient  is to focus on self-care and taking care of her daughters.  Patient is to use self talk if she needs to attend court and remind herself that her ex husband is in the one in contempt.  Patient is to work on reminding herself of the facts and that her boyfriend is not like her ex and her dad..  Patient is to continue releasing negative emotions through exercise.  Patient is to continue working on limit setting with her mother.  Patient is to affirm herself regularly.. Long-term  goal: Resolve the core conflict that is a source of anxiety.  Decrease triggered responses that occur when having to deal with her ex-husband by 50% Short-term goal: Identify the major life complex in the past and present the form the basis for present anxiety  Stevphen Meuse, [facility]

## 2022-05-31 DIAGNOSIS — F4323 Adjustment disorder with mixed anxiety and depressed mood: Secondary | ICD-10-CM | POA: Diagnosis not present

## 2022-05-31 DIAGNOSIS — F411 Generalized anxiety disorder: Secondary | ICD-10-CM | POA: Diagnosis not present

## 2022-06-01 ENCOUNTER — Ambulatory Visit: Payer: Commercial Managed Care - HMO | Admitting: Family Medicine

## 2022-06-01 DIAGNOSIS — N390 Urinary tract infection, site not specified: Secondary | ICD-10-CM | POA: Diagnosis not present

## 2022-06-05 DIAGNOSIS — F411 Generalized anxiety disorder: Secondary | ICD-10-CM | POA: Diagnosis not present

## 2022-06-05 DIAGNOSIS — F4323 Adjustment disorder with mixed anxiety and depressed mood: Secondary | ICD-10-CM | POA: Diagnosis not present

## 2022-06-08 ENCOUNTER — Encounter: Payer: Self-pay | Admitting: Family Medicine

## 2022-06-12 ENCOUNTER — Ambulatory Visit (INDEPENDENT_AMBULATORY_CARE_PROVIDER_SITE_OTHER): Payer: 59 | Admitting: Psychiatry

## 2022-06-12 DIAGNOSIS — F411 Generalized anxiety disorder: Secondary | ICD-10-CM | POA: Diagnosis not present

## 2022-06-12 NOTE — Progress Notes (Signed)
[facility] Counselor/Therapist Progress Note  Patient ID: Sabrina Nichols, MRN: [id number],    Date: 06/12/2022  Time Spent: 51 minutes start time 12:07 PM end time 12:58 PM Virtual Visit via Video Note Connected with patient by a telemedicine/telehealth application, with their informed consent, and verified patient privacy and that I am speaking with the correct person using two identifiers. I discussed the limitations, risks, security and privacy concerns of performing psychotherapy and the availability of in person appointments. I also discussed with the patient that there may be a patient responsible charge related to this service. The patient expressed understanding and agreed to proceed. I discussed the treatment planning with the patient. The patient was provided an opportunity to ask questions and all were answered. The patient agreed with the plan and demonstrated an understanding of the instructions. The patient was advised to call  our office if  symptoms worsen or feel they are in a crisis state and need immediate contact.   Therapist Location: home Patient Location: home    Treatment Type: Individual Therapy  Reported Symptoms: anxiety, triggered responses, sadness, crying spells, irritability  Mental Status Exam:  Appearance:   Casual and Neat     Behavior:  Appropriate  Motor:  Normal  Speech/Language:   Normal Rate  Affect:  Appropriate  Mood:  anxious  Thought process:  normal  Thought content:    WNL  Sensory/Perceptual disturbances:    WNL  Orientation:  oriented to person, place, time/date, and situation  Attention:  Good  Concentration:  Good  Memory:  WNL  Fund of knowledge:   Good  Insight:    Good  Judgment:   Good  Impulse Control:  Good   Risk Assessment: Danger to Self:  No Self-injurious Behavior: No Danger to Others: No Duty to Warn:no Physical Aggression / Violence:No  Access to Firearms a concern: No  Gang Involvement:No   Subjective:  Met with patient via virtual session. She shared that it has been stressful with her ex still.  He canceled daughters health insurance without telling her and it was very stressful. She has had lots of  things going on with her daughters due to end of the school issues. Her youngest daughter is having difficulty when she is away from patient.  Patient shared she is trying to take down time away from the girls so that she can be better with them.  Encouraged her to meet with her daughter and her daughter's therapist to address the issue. She had a UTI and that was bad.  She went on to share that her father's estranged brother passed away.  She shared she has realized that she has been great at shutting down and handling trauma. Helped her think through the feelings and assess if she is dealing with what she needs to or just stuffing.  Patient was able to recognize that she does take time to disengage and had times when she does cry and show emotions.  Discussed how to tell if she is not being healthy and when it is more about her having to take care of things at that time so she can't let go. Patient is also to talk with the girls about how to handle her oldest daughter's graduation with her ex and his family present.  Interventions: Solution-Oriented/Positive Psychology  Diagnosis:   ICD-10-CM   1. Generalized anxiety disorder  F41.1       Plan:  Patient is to use CBT and coping skills  to decrease anxiety symptoms.  Patient is to follow plans from session to deal with issues addressed.  Patient is to focus on self-care and taking care of her daughters.  Patient is to use self talk if she needs to attend court and remind herself that her ex husband is in the one in contempt.  Patient is to work on reminding herself of the facts and that her boyfriend is not like her ex and her dad..  Patient is to continue releasing negative emotions through exercise.  Patient is to continue working on limit setting with  her mother.  Patient is to affirm herself regularly.. Long-term goal: Resolve the core conflict that is a source of anxiety.  Decrease triggered responses that occur when having to deal with her ex-husband by 50% Short-term goal: Identify the major life complex in the past and present the form the basis for present anxiety  Stevphen Meuse, [facility]

## 2022-06-15 DIAGNOSIS — F411 Generalized anxiety disorder: Secondary | ICD-10-CM | POA: Diagnosis not present

## 2022-06-15 DIAGNOSIS — F4323 Adjustment disorder with mixed anxiety and depressed mood: Secondary | ICD-10-CM | POA: Diagnosis not present

## 2022-06-29 DIAGNOSIS — N951 Menopausal and female climacteric states: Secondary | ICD-10-CM | POA: Diagnosis not present

## 2022-07-05 DIAGNOSIS — F4323 Adjustment disorder with mixed anxiety and depressed mood: Secondary | ICD-10-CM | POA: Diagnosis not present

## 2022-07-05 DIAGNOSIS — F411 Generalized anxiety disorder: Secondary | ICD-10-CM | POA: Diagnosis not present

## 2022-07-12 ENCOUNTER — Ambulatory Visit (INDEPENDENT_AMBULATORY_CARE_PROVIDER_SITE_OTHER): Payer: 59 | Admitting: Psychiatry

## 2022-07-12 DIAGNOSIS — F411 Generalized anxiety disorder: Secondary | ICD-10-CM | POA: Diagnosis not present

## 2022-07-12 NOTE — Progress Notes (Signed)
[facility] Counselor/Therapist Progress Note  Patient ID: Sabrina Nichols, MRN: [id number],    Date: 07/12/2022  Time Spent: 51 minutes start time 10:03 AM end time 10:54 AM  Treatment Type: Individual Therapy  Reported Symptoms: anxiety, triggered responses, rumination  Mental Status Exam:  Appearance:   Well Groomed     Behavior:  Appropriate  Motor:  Normal  Speech/Language:   Normal Rate  Affect:  Appropriate  Mood:  anxious  Thought process:  normal  Thought content:    WNL  Sensory/Perceptual disturbances:    WNL  Orientation:  oriented to person, place, time/date, and situation  Attention:  Good  Concentration:  Good  Memory:  WNL  Fund of knowledge:   Good  Insight:    Good  Judgment:   Good  Impulse Control:  Good   Risk Assessment: Danger to Self:  No Self-injurious Behavior: No Danger to Others: No Duty to Warn:no Physical Aggression / Violence:No  Access to Firearms a concern: No  Gang Involvement:No   Subjective: Patient was present for session. She shared her ex did go to the 8th grade graduation. He is suing her company again and that is hard. She shared that he continues to not pay for their medical bills which is hard for her. He is also sending difficult messages to and the girls so that continues to keep things, stirred up.  Patient was encouraged to recognize that she can anticipate things not changing and that it just confirms while she is not in the relationship with him.  The importance of allowing her and her daughters to release the negative emotions appropriately that are started from that were discussed with patient.  Patient reported that she and her boyfriend are doing better but she is still finding herself more focused on having time on her own.  Encouraged her to realize that may be a healthy balance and that it is good for her to know that she can focus on self-care as well as being in a relationship.  Patient was reminded of the progress  she is made and the work she has done and how that is something she can continue.  Patient explained she is trying to take some time away from her kids because they are with her most of the time and sometimes she needs a break.  She shared that her youngest is having a hard time discussed different things that could be going on and ways for her to handle it differently.  Patient was encouraged to keep the appointments 2 hours once a month basis since that she is making progress and reporting a decrease in anxiety.  Interventions: Solution-Oriented/Positive Psychology and Insight-Oriented  Diagnosis:   ICD-10-CM   1. Generalized anxiety disorder  F41.1       Plan:  Patient is to use CBT and coping skills to decrease anxiety symptoms.  Patient is to follow plans from session to continue working on taking time for herself and helping her daughter to be okay.  Patient is to focus on self-care and taking care of her daughters.  Patient is to use self talk if she needs to attend court and remind herself that her ex husband is in the one in contempt.  Patient is to work on reminding herself of the facts and that her boyfriend is not like her ex and her dad..  Patient is to continue releasing negative emotions through exercise.  Patient is to continue working on limit setting  with her mother.  Patient is to affirm herself regularly.. Long-term goal: Resolve the core conflict that is a source of anxiety.  Decrease triggered responses that occur when having to deal with her ex-husband by 50% Short-term goal: Identify the major life complex in the past and present the form the basis for present anxiety  Stevphen Meuse, [facility]

## 2022-07-13 DIAGNOSIS — F4323 Adjustment disorder with mixed anxiety and depressed mood: Secondary | ICD-10-CM | POA: Diagnosis not present

## 2022-07-13 DIAGNOSIS — F411 Generalized anxiety disorder: Secondary | ICD-10-CM | POA: Diagnosis not present

## 2022-07-23 DIAGNOSIS — R87611 Atypical squamous cells cannot exclude high grade squamous intraepithelial lesion on cytologic smear of cervix (ASC-H): Secondary | ICD-10-CM | POA: Diagnosis not present

## 2022-07-25 ENCOUNTER — Ambulatory Visit: Payer: 59 | Admitting: Psychiatry

## 2022-07-27 DIAGNOSIS — F411 Generalized anxiety disorder: Secondary | ICD-10-CM | POA: Diagnosis not present

## 2022-07-27 DIAGNOSIS — F4323 Adjustment disorder with mixed anxiety and depressed mood: Secondary | ICD-10-CM | POA: Diagnosis not present

## 2022-07-31 DIAGNOSIS — R87611 Atypical squamous cells cannot exclude high grade squamous intraepithelial lesion on cytologic smear of cervix (ASC-H): Secondary | ICD-10-CM | POA: Diagnosis not present

## 2022-08-01 ENCOUNTER — Ambulatory Visit (INDEPENDENT_AMBULATORY_CARE_PROVIDER_SITE_OTHER): Payer: 59 | Admitting: Psychiatry

## 2022-08-01 DIAGNOSIS — F411 Generalized anxiety disorder: Secondary | ICD-10-CM | POA: Diagnosis not present

## 2022-08-01 NOTE — Progress Notes (Signed)
[facility] Counselor/Therapist Progress Note  Patient ID: Sabrina Nichols, MRN: [id number],    Date: 08/01/2022  Time Spent: 50 minutes start time 9:08 AM end time 9:58 AM  Treatment Type: Individual Therapy  Reported Symptoms: anxiety, triggered responses, sadness, rumination  Mental Status Exam:  Appearance:   Well Groomed     Behavior:  Appropriate  Motor:  Normal  Speech/Language:   Normal Rate  Affect:  Appropriate  Mood:  normal  Thought process:  normal  Thought content:    WNL  Sensory/Perceptual disturbances:    WNL  Orientation:  oriented to person, place, time/date, and situation  Attention:  Good  Concentration:  Good  Memory:  WNL  Fund of knowledge:   Good  Insight:    Good  Judgment:   Good  Impulse Control:  Good   Risk Assessment: Danger to Self:  No Self-injurious Behavior: No Danger to Others: No Duty to Warn:no Physical Aggression / Violence:No  Access to Firearms a concern: No  Gang Involvement:No   Subjective: Patient was present for session.  Discussed whether or not she wanted her note to be present in my chart and she did not.  She will let clinician know if she wants that to change.  She shared that she feels she has been more shut down with her boyfriend and is not sure what that is about.  She shared that there was an incident that was a health scare and it triggered patient.She shared that she started worrying due to being emotionally numb and at the same time spiraling.  Patient did processing set on feeling numb in the relationship, suds level 8, negative cognition "I am not safe" felt fear and anxiety in her throat and pelvic area.  Patient was able to reduce suds level to 4.  Through the processing she was able to recognize that she was doing better until her boyfriend made plans with his sons and mother and did not include her which made her feel a disconnect and that she was not really part of his family.  Her mother did not let her  express her emotions as a child and even currently as an adult some sort of negative response so that makes it difficult for her to share her emotions and easier for her just to put her walls up.  Patient agreed to discuss these issues with her boyfriend.  Interventions: Solution-Oriented/Positive Psychology, Eye Movement Desensitization and Reprocessing (EMDR), and Insight-Oriented  Diagnosis:   ICD-10-CM   1. Generalized anxiety disorder  F41.1       Plan: Patient is to use CBT and coping skills to decrease anxiety symptoms.  Patient is to follow plans from session to talk with her boyfriend about the things that she has realized in session.  Patient is to focus on self-care and taking care of her daughters.  Patient is to use self talk if she needs to attend court and remind herself that her ex husband is in the one in contempt.  Patient is to work on reminding herself of the facts/truths. Patient is to continue releasing negative emotions through exercise.  Patient is to continue working on limit setting with her mother.  Patient is to affirm herself regularly.. Long-term goal: Resolve the core conflict that is a source of anxiety.  Decrease triggered responses that occur when having to deal with her ex-husband by 50% Short-term goal: Identify the major life complex in the past and present the form the  basis for present anxiety  Stevphen Meuse, [facility]

## 2022-08-10 DIAGNOSIS — F411 Generalized anxiety disorder: Secondary | ICD-10-CM | POA: Diagnosis not present

## 2022-08-10 DIAGNOSIS — F4323 Adjustment disorder with mixed anxiety and depressed mood: Secondary | ICD-10-CM | POA: Diagnosis not present

## 2022-08-16 ENCOUNTER — Ambulatory Visit (INDEPENDENT_AMBULATORY_CARE_PROVIDER_SITE_OTHER): Payer: 59 | Admitting: Psychiatry

## 2022-08-16 ENCOUNTER — Ambulatory Visit: Payer: Self-pay | Admitting: Sports Medicine

## 2022-08-16 ENCOUNTER — Other Ambulatory Visit: Payer: Self-pay

## 2022-08-16 VITALS — BP 110/68 | Ht 65.0 in | Wt 160.0 lb

## 2022-08-16 DIAGNOSIS — M79672 Pain in left foot: Secondary | ICD-10-CM

## 2022-08-16 DIAGNOSIS — F411 Generalized anxiety disorder: Secondary | ICD-10-CM | POA: Diagnosis not present

## 2022-08-16 DIAGNOSIS — S96909A Unspecified injury of unspecified muscle and tendon at ankle and foot level, unspecified foot, initial encounter: Secondary | ICD-10-CM | POA: Diagnosis not present

## 2022-08-16 NOTE — Assessment & Plan Note (Signed)
With chronic pain and small calcification possibly acting like a foreign body to cause impingement pain I suggested a trial of ESWT today If this helps we will consider a series of weekly treatments for 6 weeks  ESWT Left Ankle Freq 16 Impulses 2500 Power level 120 Mj Large head size Procedure tolerated well by the patient  Evaluate to see if she responds If not we will consider a direct CSI into the injured area If responding - RX weekly x 6 weeks and repeat US at that time  Note heel wedges taken off medially and applied laterally to her orthotics to take pressure off lateral ankle and foot,

## 2022-08-16 NOTE — Progress Notes (Signed)
CC: Left ankle pain  First seen by me 3/23 Peroneus tertius appeared injured and a ganglion cyst noted We gave her orthotic support and HEP However, some pain persisted in the same area She also gets some lateral foot pain when wearing custom orthotics  XR and evaluations at orthopedic office did not find any other specific DX to cause her pain  Her initial injury she thinks was Nov. 2022 and started hurting after completing a beach VB game  No significant swelling Feels pain periodically just anterior to lateral malleolus Notes TTP in that area  PE Pleasant F in NAD BP 110/68   Ht 5\' 5"  (1.651 m)   Wt 160 lb (72.6 kg)   BMI 26.63 kg/m   Feet Long arch collapse bilat with resting pronation However rear foot is neutral Widening of forefoot with loss of transverse arch  Ankles  show increase laxity in inversion and PF  - Increased motion in eversion Dorsiflexion is normal but slightly more limited on left Ant. 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 Darrick Penna, MD

## 2022-08-16 NOTE — Progress Notes (Signed)
      [facility] Counselor/Therapist Progress Note  Patient ID: Sabrina Nichols, MRN: [id number],    Date: 08/16/2022  Time Spent: 51 minutes start time 10:04 AM end time 10:55 AM  Treatment Type: Individual Therapy  Reported Symptoms: anxiety,sadness, triggered responses  Mental Status Exam:  Appearance:   Well Groomed     Behavior:  Appropriate  Motor:  Normal  Speech/Language:   Normal Rate  Affect:  Appropriate  Mood:  normal  Thought process:  normal  Thought content:    WNL  Sensory/Perceptual disturbances:    WNL  Orientation:  oriented to person, place, time/date, and situation  Attention:  Good  Concentration:  Good  Memory:  WNL  Fund of knowledge:   Good  Insight:    Good  Judgment:   Good  Impulse Control:  Good   Risk Assessment: Danger to Self:  No Self-injurious Behavior: No Danger to Others: No Duty to Warn:no Physical Aggression / Violence:No  Access to Firearms a concern: No  Gang Involvement:No   Subjective: patient was present for session. She shared she has had a lot of anxiety over issues with her daughter.  She went on to share that she realized that some of that was triggered due to issues within her relationship.  Had patient think through what was creating the problems in the relationship and as she was processing she realized that she is feeling a great deal of pressure.  Encouraged her to realize that she does not have to make a decision at this point but can express how she needs with her boyfriend and then he can make a decision about what he wants from the relationship and whether or not they need to progress.  The importance of staying focused on what she can control fix and change was discussed with patient.  Interventions: Solution-Oriented/Positive Psychology and Insight-Oriented  Diagnosis:   ICD-10-CM   1. Generalized anxiety disorder  F41.1       Plan: Patient is to use CBT and coping skills to decrease anxiety symptoms.  Patient is  to follow plans from session to talk with her boyfriend about the things that she has realized in session.  Patient is to focus on self-care and taking care of her daughters.  Patient is to use self talk if she needs to attend court and remind herself that her ex husband is in the one in contempt.  Patient is to work on reminding herself of the facts/truths. Patient is to continue releasing negative emotions through exercise.  Patient is to continue working on limit setting with her mother.  Patient is to affirm herself regularly.. Long-term goal: Resolve the core conflict that is a source of anxiety.  Decrease triggered responses that occur when having to deal with her ex-husband by 50% Short-term goal: Identify the major life complex in the past and present the form the basis for present anxiety  Stevphen Meuse, [facility]

## 2022-08-17 ENCOUNTER — Ambulatory Visit: Payer: Self-pay | Admitting: Orthopaedic Surgery

## 2022-08-22 ENCOUNTER — Ambulatory Visit (INDEPENDENT_AMBULATORY_CARE_PROVIDER_SITE_OTHER): Payer: Self-pay | Admitting: Sports Medicine

## 2022-08-22 VITALS — Ht 65.0 in

## 2022-08-22 DIAGNOSIS — F4323 Adjustment disorder with mixed anxiety and depressed mood: Secondary | ICD-10-CM | POA: Diagnosis not present

## 2022-08-22 DIAGNOSIS — F411 Generalized anxiety disorder: Secondary | ICD-10-CM | POA: Diagnosis not present

## 2022-08-22 DIAGNOSIS — S96909A Unspecified injury of unspecified muscle and tendon at ankle and foot level, unspecified foot, initial encounter: Secondary | ICD-10-CM

## 2022-08-22 NOTE — Assessment & Plan Note (Signed)
Here for ESWT # 2

## 2022-08-22 NOTE — Progress Notes (Signed)
ESWT # 2  Treatment to the left lateral ankle for a remote partial tear of the peroneus tertius tendon  Power level 120 mJ Head size small Frequency 16 Impulses 3000  Patient tolerated the procedure well with some mild pain near the fibula as we provided the therapy along the peroneus tertius and some over the peroneal tendons  Return for 4-6 sessions Notes she felt she got pretty good benefit out of the first session

## 2022-08-27 ENCOUNTER — Ambulatory Visit: Payer: 59 | Admitting: Psychiatry

## 2022-08-28 ENCOUNTER — Ambulatory Visit (INDEPENDENT_AMBULATORY_CARE_PROVIDER_SITE_OTHER): Payer: 59 | Admitting: Sports Medicine

## 2022-08-28 VITALS — Ht 65.0 in

## 2022-08-28 DIAGNOSIS — S96909A Unspecified injury of unspecified muscle and tendon at ankle and foot level, unspecified foot, initial encounter: Secondary | ICD-10-CM

## 2022-08-28 NOTE — Assessment & Plan Note (Signed)
For ESWT # 3 Symptomatic improvement

## 2022-08-28 NOTE — Progress Notes (Signed)
ESWT # 3  After 2 sessions was able to run for first time in awhile.  Definitely feels less impingement type pain.  3000 IMPULSES Smallhead size  Frequency 16  Shocks applied to syndesmosis and area of peroneus tertius along anterior border of lateral malleolus  Tolerated well.  Continue weekly through 6 treatments.

## 2022-08-29 ENCOUNTER — Ambulatory Visit: Payer: 59 | Admitting: Psychiatry

## 2022-08-29 DIAGNOSIS — R87611 Atypical squamous cells cannot exclude high grade squamous intraepithelial lesion on cytologic smear of cervix (ASC-H): Secondary | ICD-10-CM | POA: Diagnosis not present

## 2022-09-04 ENCOUNTER — Ambulatory Visit (INDEPENDENT_AMBULATORY_CARE_PROVIDER_SITE_OTHER): Payer: Self-pay | Admitting: Sports Medicine

## 2022-09-04 VITALS — Ht 65.0 in

## 2022-09-04 DIAGNOSIS — S96909A Unspecified injury of unspecified muscle and tendon at ankle and foot level, unspecified foot, initial encounter: Secondary | ICD-10-CM

## 2022-09-04 NOTE — Progress Notes (Signed)
ESWT # 4  Focused on Peroneus tertius of left Ankle Definitely has noted improvement  Impulses 3000 Power 120 mJ Head size : large Frequency: 16  Patient tolerated the procedure well and will continue weekly for 6 sessions

## 2022-09-04 NOTE — Assessment & Plan Note (Signed)
Continue with ESWT #4 of 6 planned treatments

## 2022-09-07 DIAGNOSIS — F411 Generalized anxiety disorder: Secondary | ICD-10-CM | POA: Diagnosis not present

## 2022-09-07 DIAGNOSIS — F4323 Adjustment disorder with mixed anxiety and depressed mood: Secondary | ICD-10-CM | POA: Diagnosis not present

## 2022-09-10 ENCOUNTER — Ambulatory Visit (INDEPENDENT_AMBULATORY_CARE_PROVIDER_SITE_OTHER): Payer: 59 | Admitting: Psychiatry

## 2022-09-10 DIAGNOSIS — F411 Generalized anxiety disorder: Secondary | ICD-10-CM

## 2022-09-10 NOTE — Progress Notes (Signed)
[facility] Counselor/Therapist Progress Note  Patient ID: Sabrina Nichols, MRN: [id number],    Date: 09/10/2022  Time Spent: 66 minutes start time 9:57 AM end time 11:03 AM Virtual Visit via Video Note Connected with patient by a telemedicine/telehealth application, with their informed consent, and verified patient privacy and that I am speaking with the correct person using two identifiers. I discussed the limitations, risks, security and privacy concerns of performing psychotherapy and the availability of in person appointments. I also discussed with the patient that there may be a patient responsible charge related to this service. The patient expressed understanding and agreed to proceed. I discussed the treatment planning with the patient. The patient was provided an opportunity to ask questions and all were answered. The patient agreed with the plan and demonstrated an understanding of the instructions. The patient was advised to call  our office if  symptoms worsen or feel they are in a crisis state and need immediate contact.   Therapist Location: home Patient Location: home    Treatment Type: Individual Therapy  Reported Symptoms: anxiety, sadness, rumination, triggered responses  Mental Status Exam:  Appearance:   Well Groomed     Behavior:  Appropriate  Motor:  Normal  Speech/Language:   Normal Rate  Affect:  Appropriate and Tearful  Mood:  anxious and sad  Thought process:  normal  Thought content:    WNL  Sensory/Perceptual disturbances:    WNL  Orientation:  oriented to person, place, time/date, and situation  Attention:  Good  Concentration:  Good  Memory:  WNL  Fund of knowledge:   Good  Insight:    Good  Judgment:   Good  Impulse Control:  Good   Risk Assessment: Danger to Self:  No Self-injurious Behavior: No Danger to Others: No Duty to Warn:no Physical Aggression / Violence:No  Access to Firearms a concern: No  Gang Involvement:No   Subjective:  Met with patient via virtual session. She had a procedure that was painful, but it went okay. She shared that things have still been up and down with her boyfriend and it has been triggering for her. Had patient process through the fact that she let her guard down with him. SUDS level 9, negative cognition "It's my fault" felt sadness anxiety and powerless in her throat chest and stomach.She was able to reduce SUDS level to 6. Encouraged her to let her feelings give her information about what she wants to do regarding her relationship. She is to make sure to journal what surfaces.   Interventions: Solution-Oriented/Positive Psychology, Eye Movement Desensitization and Reprocessing (EMDR), and Insight-Oriented  Diagnosis:   ICD-10-CM   1. Generalized anxiety disorder  F41.1       Plan:  Patient is to use CBT and coping skills to decrease anxiety symptoms.  Patient is to follow plans from session to Journal what surfaces between sessions.  Patient is to focus on self-care and taking care of her daughters.  Patient is to use self talk if she needs to attend court and remind herself that her ex husband is in the one in contempt.  Patient is to work on reminding herself of the facts/truths. Patient is to continue releasing negative emotions through exercise.  Patient is to continue working on limit setting with her mother.  Patient is to affirm herself regularly.. Long-term goal: Resolve the core conflict that is a source of anxiety.  Decrease triggered responses that occur when having to deal with her ex-husband  by 50% Short-term goal: Identify the major life complex in the past and present the form the basis for present anxiety  Stevphen Meuse, [facility]

## 2022-09-11 ENCOUNTER — Ambulatory Visit (INDEPENDENT_AMBULATORY_CARE_PROVIDER_SITE_OTHER): Payer: Self-pay | Admitting: Sports Medicine

## 2022-09-11 VITALS — Ht 65.0 in

## 2022-09-11 DIAGNOSIS — S96909A Unspecified injury of unspecified muscle and tendon at ankle and foot level, unspecified foot, initial encounter: Secondary | ICD-10-CM

## 2022-09-11 NOTE — Progress Notes (Signed)
Patient has had significant improvement with ESWT This is session #5 and we may offer 6 total sessions She had a little more pain when I shifted to the smallest focal head She feels she has done the best with the medium head size in terms of symptoms  ESWT #5 3000 impulses Head size medium 120 mJ 16 frequency Location along the peroneus tertius tendon of the left ankle and up the syndesmosis A few impulses given to the peroneus brevis and longus

## 2022-09-11 NOTE — Assessment & Plan Note (Signed)
She has done well with now 5 sessions of ESWT  We may offer her an optional sixth session  After this we may want to look at the tendon in 6 to 8 weeks to see if healing has occurred

## 2022-09-12 ENCOUNTER — Ambulatory Visit (INDEPENDENT_AMBULATORY_CARE_PROVIDER_SITE_OTHER): Payer: 59 | Admitting: Psychiatry

## 2022-09-12 DIAGNOSIS — F411 Generalized anxiety disorder: Secondary | ICD-10-CM

## 2022-09-12 NOTE — Progress Notes (Signed)
      [facility] Counselor/Therapist Progress Note  Patient ID: Britanya Reincke, MRN: [id number],    Date: 09/12/2022  Time Spent: 50 minutes start time 11:12 AM end time 12:02 PM  Treatment Type: Individual Therapy  Reported Symptoms: anxiety, triggered responses, sadness, rumination  Mental Status Exam:  Appearance:   Well Groomed     Behavior:  Appropriate  Motor:  Normal  Speech/Language:   Normal Rate  Affect:  Appropriate  Mood:  anxious and sad  Thought process:  normal  Thought content:    WNL  Sensory/Perceptual disturbances:    WNL  Orientation:  oriented to person, place, time/date, situation, and day of week  Attention:  Good  Concentration:  Good  Memory:  WNL  Fund of knowledge:   Good  Insight:    Good  Judgment:   Good  Impulse Control:  Good   Risk Assessment: Danger to Self:  No Self-injurious Behavior: No Danger to Others: No Duty to Warn:no Physical Aggression / Violence:No  Access to Firearms a concern: No  Gang Involvement:No   Subjective: Patient was present for session. She shared that things are still difficult with her boyfriend.  She shared she is still uncertain of the direction that they are going in at this time. Did processing set on him pulling back, SUDS level 8, negative cognition "I did something wrong", felt sadness, fear, and anger in her stomach and chest. She was able to reduce SUDS level. She was able to realize what was important to her to discuss with him in their session with his therapist.  Interventions: Solution-Oriented/Positive Psychology, Eye Movement Desensitization and Reprocessing (EMDR), and Insight-Oriented  Diagnosis:   ICD-10-CM   1. Generalized anxiety disorder  F41.1       Plan:  Patient is to use CBT and coping skills to decrease anxiety symptoms.  Patient is to follow plans from session to discuss what surfaced with boyfriend in their next session with his therapist.  Patient is to focus on self-care and  taking care of her daughters.  Patient is to use self talk if she needs to attend court and remind herself that her ex husband is in the one in contempt.  Patient is to work on reminding herself of the facts/truths. Patient is to continue releasing negative emotions through exercise.  Patient is to continue working on limit setting with her mother.  Patient is to affirm herself regularly.. Long-term goal: Resolve the core conflict that is a source of anxiety.  Decrease triggered responses that occur when having to deal with her ex-husband by 50% Short-term goal: Identify the major life complex in the past and present the form the basis for present anxiety  Stevphen Meuse, [facility]

## 2022-09-13 DIAGNOSIS — F4323 Adjustment disorder with mixed anxiety and depressed mood: Secondary | ICD-10-CM | POA: Diagnosis not present

## 2022-09-13 DIAGNOSIS — F411 Generalized anxiety disorder: Secondary | ICD-10-CM | POA: Diagnosis not present

## 2022-09-14 DIAGNOSIS — M7542 Impingement syndrome of left shoulder: Secondary | ICD-10-CM | POA: Diagnosis not present

## 2022-09-14 DIAGNOSIS — M9902 Segmental and somatic dysfunction of thoracic region: Secondary | ICD-10-CM | POA: Diagnosis not present

## 2022-09-14 DIAGNOSIS — M9907 Segmental and somatic dysfunction of upper extremity: Secondary | ICD-10-CM | POA: Diagnosis not present

## 2022-09-14 DIAGNOSIS — M9901 Segmental and somatic dysfunction of cervical region: Secondary | ICD-10-CM | POA: Diagnosis not present

## 2022-09-18 ENCOUNTER — Ambulatory Visit: Payer: 59 | Admitting: Psychiatry

## 2022-09-18 ENCOUNTER — Ambulatory Visit (INDEPENDENT_AMBULATORY_CARE_PROVIDER_SITE_OTHER): Payer: 59 | Admitting: Sports Medicine

## 2022-09-18 DIAGNOSIS — F411 Generalized anxiety disorder: Secondary | ICD-10-CM | POA: Diagnosis not present

## 2022-09-18 DIAGNOSIS — S96909A Unspecified injury of unspecified muscle and tendon at ankle and foot level, unspecified foot, initial encounter: Secondary | ICD-10-CM

## 2022-09-18 DIAGNOSIS — F4323 Adjustment disorder with mixed anxiety and depressed mood: Secondary | ICD-10-CM | POA: Diagnosis not present

## 2022-09-18 NOTE — Progress Notes (Signed)
Patient has had significant improvement with ESWT after 3 sessions. This is session #6  She had a little more pain when I shifted to the smallest focal head She feels she has done the best with the medium head size in terms of symptoms Plateau of benefit from before and now some pain at end of day   ESWT #6 3000 impulses Head size medium 120 mJ 16 frequency Location along the peroneus tertius tendon of the left ankle and up the syndesmosis A few impulses given to the peroneus brevis and longus        Diagnostic US Left ankle There is still what looks to be a small loose body at anterior distal tibio fibular area near ankle joint This has hypoechoic change surrounding the fragment. Peroneus brevis and longus appears intact and normal with no swelling on this exam. Peroneus tertius appears intact and no swelling at tendon but in the soft tissue near the hypoechoic fragment.  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

## 2022-09-18 NOTE — Assessment & Plan Note (Signed)
Wondering if this calcifications is an OCD lesion possibly avulsed by Per Tert tendon.  ESWT today  Wait 4 weeks to see if response  If not consider CSI to the loose body or perhaps continue longer course of ESWT.

## 2022-10-01 ENCOUNTER — Ambulatory Visit: Payer: 59 | Admitting: Psychiatry

## 2022-10-08 ENCOUNTER — Ambulatory Visit (INDEPENDENT_AMBULATORY_CARE_PROVIDER_SITE_OTHER): Payer: 59 | Admitting: Psychiatry

## 2022-10-08 DIAGNOSIS — F411 Generalized anxiety disorder: Secondary | ICD-10-CM | POA: Diagnosis not present

## 2022-10-08 NOTE — Progress Notes (Unsigned)
[facility] Counselor/Therapist Progress Note  Patient ID: Sabrina Nichols, MRN: [id number],    Date: 10/08/2022  Time Spent: 58 minutes start time 1:00 PM end time 1:58 PM Virtual Visit via Video Note Connected with patient by a telemedicine/telehealth application, with their informed consent, and verified patient privacy and that I am speaking with the correct person using two identifiers. I discussed the limitations, risks, security and privacy concerns of performing psychotherapy and the availability of in person appointments. I also discussed with the patient that there may be a patient responsible charge related to this service. The patient expressed understanding and agreed to proceed. I discussed the treatment planning with the patient. The patient was provided an opportunity to ask questions and all were answered. The patient agreed with the plan and demonstrated an understanding of the instructions. The patient was advised to call  our office if  symptoms worsen or feel they are in a crisis state and need immediate contact.   Therapist Location: home Patient Location: home    Treatment Type: Individual Therapy  Reported Symptoms: anxiety, sadness, triggered responses, rumination, grief, anger/hurt, sleep issues  Mental Status Exam:  Appearance:   Casual     Behavior:  Appropriate  Motor:  Normal  Speech/Language:   Normal Rate  Affect:  Appropriate  Mood:  normal  Thought process:  normal  Thought content:    WNL  Sensory/Perceptual disturbances:    WNL  Orientation:  oriented to person, place, time/date, and situation  Attention:  Good  Concentration:  Good  Memory:  WNL  Fund of knowledge:   Good  Insight:    Good  Judgment:   Good  Impulse Control:  Good   Risk Assessment: Danger to Self:  No Self-injurious Behavior: No Danger to Others: No Duty to Warn:no Physical Aggression / Violence:No  Access to Firearms a concern: No  Gang Involvement:No    Subjective: Met with patient via virtual session. She shared that she and her boyfriend are completely done with their relationship.  She shared how things progressed with the relationship and how things ended. Patient is still processing all that happened and what she needs to learn from the situation. Encouraged her to realize that she can trust herself. She was able to acknowledged that she has moved forward to a degree and she is dealing with the ending of relationships with people that are his friends. She was able to do some journaling about the relationship which was helpful. She also shared that her daughter is starting to talk to guys, helped her think through how she wanted to progress through that. She shared that it is stressful helping to take care of her sister.  She explained she is also having anxiety due to her dad being in [place] and having been there over the past 4 months she is concerned on how things will be when he returns since her parents are now divorced.  Encouraged her to recognize that she has always been able to manage things and that she can still have a cognitive dissidence with the situations that she does not have to feel overwhelmed and like she has to fix anything.  Encouraged patient to continue working on her CBT skills as well as exercising and journaling to help her to continue healing from the break-up.  Patient was also able to identify in session the pattern with guys that she has encouraged her to recognize that things are moving in a better direction  and she is making better choices on how she handles relationships.  Interventions: Cognitive Behavioral Therapy and Solution-Oriented/Positive Psychology  Diagnosis:   ICD-10-CM   1. Generalized anxiety disorder  F41.1       Plan:  Patient is to use CBT and coping skills to decrease anxiety symptoms. Patient is to focus on self-care and taking care of her daughters.  Patient is to continue journaling through  help her process through the relationship ending and what she wants to continue working on in treatment.  Patient is to work on reminding herself of the facts/truths. Patient is to continue releasing negative emotions through exercise.  Patient is to continue working on limit setting with others.  Patient is to affirm herself regularly.. Long-term goal: Resolve the core conflict that is a source of anxiety.  Decrease triggered responses that occur when having to deal with her ex-husband by 50% Short-term goal: Identify the major life complex in the past and present the form the basis for present anxiety  Stevphen Meuse, [facility]

## 2022-10-11 DIAGNOSIS — Z6826 Body mass index (BMI) 26.0-26.9, adult: Secondary | ICD-10-CM | POA: Diagnosis not present

## 2022-10-11 DIAGNOSIS — Z713 Dietary counseling and surveillance: Secondary | ICD-10-CM | POA: Diagnosis not present

## 2022-10-15 ENCOUNTER — Ambulatory Visit: Payer: 59 | Admitting: Psychiatry

## 2022-10-26 DIAGNOSIS — E039 Hypothyroidism, unspecified: Secondary | ICD-10-CM | POA: Diagnosis not present

## 2022-10-26 DIAGNOSIS — E559 Vitamin D deficiency, unspecified: Secondary | ICD-10-CM | POA: Diagnosis not present

## 2022-10-26 DIAGNOSIS — R5383 Other fatigue: Secondary | ICD-10-CM | POA: Diagnosis not present

## 2022-10-26 DIAGNOSIS — Z131 Encounter for screening for diabetes mellitus: Secondary | ICD-10-CM | POA: Diagnosis not present

## 2022-10-26 DIAGNOSIS — Z1329 Encounter for screening for other suspected endocrine disorder: Secondary | ICD-10-CM | POA: Diagnosis not present

## 2022-10-26 DIAGNOSIS — E538 Deficiency of other specified B group vitamins: Secondary | ICD-10-CM | POA: Diagnosis not present

## 2022-10-26 DIAGNOSIS — N951 Menopausal and female climacteric states: Secondary | ICD-10-CM | POA: Diagnosis not present

## 2022-10-26 DIAGNOSIS — R790 Abnormal level of blood mineral: Secondary | ICD-10-CM | POA: Diagnosis not present

## 2022-10-26 DIAGNOSIS — R7989 Other specified abnormal findings of blood chemistry: Secondary | ICD-10-CM | POA: Diagnosis not present

## 2022-10-26 DIAGNOSIS — M2550 Pain in unspecified joint: Secondary | ICD-10-CM | POA: Diagnosis not present

## 2022-11-12 ENCOUNTER — Ambulatory Visit (INDEPENDENT_AMBULATORY_CARE_PROVIDER_SITE_OTHER): Payer: 59 | Admitting: Psychiatry

## 2022-11-12 DIAGNOSIS — F411 Generalized anxiety disorder: Secondary | ICD-10-CM

## 2022-11-12 NOTE — Progress Notes (Signed)
[facility] Counselor/Therapist Progress Note  Patient ID: Sabrina Nichols, MRN: [id number],    Date: 11/12/2022  Time Spent: 58 minutes start time 2:00 PM end time 2:58 PM Virtual Visit via Video Note Connected with patient by a telemedicine/telehealth application, with their informed consent, and verified patient privacy and that I am speaking with the correct person using two identifiers. I discussed the limitations, risks, security and privacy concerns of performing psychotherapy and the availability of in person appointments. I also discussed with the patient that there may be a patient responsible charge related to this service. The patient expressed understanding and agreed to proceed. I discussed the treatment planning with the patient. The patient was provided an opportunity to ask questions and all were answered. The patient agreed with the plan and demonstrated an understanding of the instructions. The patient was advised to call  our office if  symptoms worsen or feel they are in a crisis state and need immediate contact.   Therapist Location: home Patient Location: home    Treatment Type: Individual Therapy  Reported Symptoms: anxiety, overwhelmed, sadness, triggered responses, rumination, sleep issues  Mental Status Exam:  Appearance:   Casual     Behavior:  Appropriate  Motor:  Normal  Speech/Language:   Normal Rate  Affect:  Appropriate  Mood:  anxious  Thought process:  normal  Thought content:    WNL  Sensory/Perceptual disturbances:    WNL  Orientation:  oriented to person, place, time/date, and situation  Attention:  Good  Concentration:  Good  Memory:  WNL  Fund of knowledge:   Good  Insight:    Good  Judgment:   Good  Impulse Control:  Good   Risk Assessment: Danger to Self:  No Self-injurious Behavior: No Danger to Others: No Duty to Warn:no Physical Aggression / Violence:No  Access to Firearms a concern: No  Gang Involvement:No   Subjective:  Met with patient via virtual session. She shared that she has been overwhelmed lately and felt like she was burning out. She shared that with hormonal changes, being the care taker of her daughters and her sister, hand an issue with a friend, and her ex husband has started messaging her again. Patient discussed the conflict with a friend, encouraged her to communicate with the friend in person since text can be misunderstood. Also, reminded her to realize that her intent was not bad and that she can affirm herself that things can be resolved. Patient shared the anxiety she is feeling with her ex's messages.  Had her think through the facts of the situation and to remind herself that she is doing what she needs to do and she can let her attorney handle the situation.  Also there is lots of documentation from both she and the girls that shows how they have tried to deal with things. Patient was encouraged to work on releasing emotions daily through exercise and to take time to do some yoga prior to bedtime.   Interventions: Cognitive Behavioral Therapy and Solution-Oriented/Positive Psychology  Diagnosis:   ICD-10-CM   1. Generalized anxiety disorder  F41.1       Plan:  Patient is to use CBT and coping skills to decrease anxiety symptoms. Patient is to focus on self-care and taking care of her daughters.  Patient is to continue journaling through help her process through the relationship ending and what she wants to continue working on in treatment.  Patient is to work on reminding herself of  the facts/truths. Patient is to continue releasing negative emotions through exercise and yoga.  Patient is to continue working on limit setting with others.  Patient is to affirm herself regularly.. Long-term goal: Resolve the core conflict that is a source of anxiety.  Decrease triggered responses that occur when having to deal with her ex-husband by 50% Short-term goal: Identify the major life complex in the past  and present the form the basis for present anxiety  Stevphen Meuse, [facility]

## 2022-11-15 ENCOUNTER — Other Ambulatory Visit: Payer: Self-pay | Admitting: *Deleted

## 2022-11-15 DIAGNOSIS — S96909A Unspecified injury of unspecified muscle and tendon at ankle and foot level, unspecified foot, initial encounter: Secondary | ICD-10-CM

## 2022-11-15 DIAGNOSIS — M79672 Pain in left foot: Secondary | ICD-10-CM

## 2022-11-16 ENCOUNTER — Encounter: Payer: Self-pay | Admitting: Sports Medicine

## 2022-11-19 ENCOUNTER — Other Ambulatory Visit: Payer: 59

## 2022-11-22 ENCOUNTER — Ambulatory Visit: Payer: 59 | Admitting: Psychiatry

## 2022-11-28 ENCOUNTER — Ambulatory Visit: Payer: 59 | Admitting: Psychiatry

## 2022-11-29 ENCOUNTER — Encounter: Payer: Self-pay | Admitting: Sports Medicine

## 2022-12-05 ENCOUNTER — Ambulatory Visit (INDEPENDENT_AMBULATORY_CARE_PROVIDER_SITE_OTHER): Payer: 59 | Admitting: Psychiatry

## 2022-12-05 DIAGNOSIS — F411 Generalized anxiety disorder: Secondary | ICD-10-CM

## 2022-12-05 NOTE — Progress Notes (Signed)
[facility] Counselor/Therapist Progress Note  Patient ID: Sabrina Nichols, MRN: [id number],    Date: 12/05/2022  Time Spent: 51 minutes start time 10:06 AM end time 10:57 AM  Treatment Type: Individual Therapy  Reported Symptoms: anxiety, sadness, triggered responses, rumination, focusing issues   Mental Status Exam:  Appearance:   Well Groomed     Behavior:  Appropriate  Motor:  Normal  Speech/Language:   Normal Rate  Affect:  Appropriate  Mood:  anxious  Thought process:  normal  Thought content:    WNL  Sensory/Perceptual disturbances:    WNL  Orientation:  oriented to person, place, time/date, and situation  Attention:  Good  Concentration:  Good  Memory:  WNL  Fund of knowledge:   Good  Insight:    Good  Judgment:   Good  Impulse Control:  Good   Risk Assessment: Danger to Self:  No Self-injurious Behavior: No Danger to Others: No Duty to Warn:no Physical Aggression / Violence:No  Access to Firearms a concern: No  Gang Involvement:No   Subjective: Patient was present for session. She shared that her daughter just got her driver's permit and that is stirring up anxiety for her. She went on to share that it has just been she and the girls for so long it is harder for her to let go. She was able to recognize that some of the anxiety is coming from her fear that her ex is going to try and take the kids from her.  Allow patient time to discuss where she feels her feelings are coming from and what is normal and what she needs to think through.  Through the discussion discussed the importance of focusing on her thoughts to help move her in a different direction.  Gave patient handouts on cognitive distortions as well as ways to deal with them appropriately and reviewed them in session.  The importance of focusing on the facts/truth was also discussed with patient.  Also had patient think through the what if regarding dealing with her ex.  As she was able to think through  the what if she was able to recognize even if the worst thing happen and he got back to having the kids 1 weekend a month that he does not follow through on things and so it would be temporary anyways.  Patient was also encouraged to figure out some ways that she can have time for herself throughout the week.  She agreed to talk to her kids about taking 1 night a week to either join a class and do something for her to make it time where she gets to spend with adults.  Interventions: Cognitive Behavioral Therapy and Solution-Oriented/Positive Psychology  Diagnosis:   ICD-10-CM   1. Generalized anxiety disorder  F41.1       Plan:  Patient is to use CBT and coping skills to decrease anxiety symptoms.  Patient is to practice handouts in session and work on her cognitive distortions.  Patient is to start taking 1 night a week where she does something for herself.  Patient is to work on reminding herself of the facts/truths. Patient is to continue releasing negative emotions through exercise and yoga.  Patient is to continue working on limit setting with others.  Patient is to affirm herself regularly. Long-term goal: Resolve the core conflict that is a source of anxiety.  Decrease triggered responses that occur when having to deal with her ex-husband by 50% Short-term goal: Identify the  major life complex in the past and present the form the basis for present anxiety  Stevphen Meuse, [facility]

## 2022-12-06 DIAGNOSIS — F4323 Adjustment disorder with mixed anxiety and depressed mood: Secondary | ICD-10-CM | POA: Diagnosis not present

## 2022-12-07 DIAGNOSIS — N879 Dysplasia of cervix uteri, unspecified: Secondary | ICD-10-CM | POA: Diagnosis not present

## 2022-12-07 DIAGNOSIS — R87611 Atypical squamous cells cannot exclude high grade squamous intraepithelial lesion on cytologic smear of cervix (ASC-H): Secondary | ICD-10-CM | POA: Diagnosis not present

## 2022-12-11 DIAGNOSIS — F4323 Adjustment disorder with mixed anxiety and depressed mood: Secondary | ICD-10-CM | POA: Diagnosis not present

## 2022-12-12 ENCOUNTER — Ambulatory Visit
Admission: RE | Admit: 2022-12-12 | Discharge: 2022-12-12 | Disposition: A | Discharge status: Home / Self Care | Payer: 59 | Source: Ambulatory Visit | Attending: Sports Medicine | Admitting: Sports Medicine

## 2022-12-12 DIAGNOSIS — M25572 Pain in left ankle and joints of left foot: Secondary | ICD-10-CM | POA: Diagnosis not present

## 2022-12-12 DIAGNOSIS — M76822 Posterior tibial tendinitis, left leg: Secondary | ICD-10-CM | POA: Diagnosis not present

## 2022-12-12 DIAGNOSIS — S96909A Unspecified injury of unspecified muscle and tendon at ankle and foot level, unspecified foot, initial encounter: Secondary | ICD-10-CM

## 2022-12-12 DIAGNOSIS — R600 Localized edema: Secondary | ICD-10-CM | POA: Diagnosis not present

## 2022-12-12 DIAGNOSIS — M79672 Pain in left foot: Secondary | ICD-10-CM

## 2022-12-19 ENCOUNTER — Ambulatory Visit: Payer: 59 | Admitting: Psychiatry

## 2022-12-20 ENCOUNTER — Other Ambulatory Visit: Payer: 59

## 2022-12-20 DIAGNOSIS — F4323 Adjustment disorder with mixed anxiety and depressed mood: Secondary | ICD-10-CM | POA: Diagnosis not present

## 2022-12-25 ENCOUNTER — Encounter: Payer: Self-pay | Admitting: *Deleted

## 2022-12-27 DIAGNOSIS — F4323 Adjustment disorder with mixed anxiety and depressed mood: Secondary | ICD-10-CM | POA: Diagnosis not present

## 2023-01-04 ENCOUNTER — Other Ambulatory Visit: Payer: Self-pay | Admitting: *Deleted

## 2023-01-04 MED ORDER — NITROGLYCERIN 0.2 MG/HR TD PT24: MEDICATED_PATCH | TRANSDERMAL | 1 refills | Status: AC

## 2023-01-31 ENCOUNTER — Ambulatory Visit (INDEPENDENT_AMBULATORY_CARE_PROVIDER_SITE_OTHER): Payer: 59 | Admitting: Psychiatry

## 2023-01-31 DIAGNOSIS — F411 Generalized anxiety disorder: Secondary | ICD-10-CM | POA: Diagnosis not present

## 2023-01-31 NOTE — Progress Notes (Signed)
      [facility] Counselor/Therapist Progress Note  Patient ID: Sabrina Nichols, MRN: [id number],    Date: 01/31/2023  Time Spent: 52 minute start time 1:06 PM end time 1:58 PM  Treatment Type: Individual Therapy  Reported Symptoms: anxiety, sadness, triggered responses, grief issues, rumination  Mental Status Exam:  Appearance:   Well Groomed     Behavior:  Appropriate  Motor:  Normal  Speech/Language:   Normal Rate  Affect:  Appropriate tearful  Mood:  anxious  Thought process:  normal  Thought content:    WNL  Sensory/Perceptual disturbances:    WNL  Orientation:  oriented to person, place, time/date, and situation  Attention:  Good  Concentration:  Good  Memory:  WNL  Fund of knowledge:   Good  Insight:    Good  Judgment:   Good  Impulse Control:  Good   Risk Assessment: Danger to Self:  No Self-injurious Behavior: No Danger to Others: No Duty to Warn:no Physical Aggression / Violence:No  Access to Firearms a concern: No  Gang Involvement:No   Subjective: Patient was present for session. She shared that November and December were hard months with the break up for her and her parent's marriage. She shared it brought up grief for her. She was able to see she is still finding herself missing the closeness and having that person. Her daughter got her driver's permit even though it was a good thing but it is creating lots of stress. She is realizing her worries are extreme.  Allowed patient time to discuss the things that she is realizing and think through what she wants to start focusing on to help things move in a more positive direction.  Patient was able to see that she needs some time for herself and that that is okay.  Discussed how to communicate that to her daughter.  Patient is also to work on reminding herself of the facts/truce and all that she has done positively with her.  Interventions: Cognitive Behavioral Therapy, Solution-Oriented/Positive Psychology, and  Insight-Oriented  Diagnosis:   ICD-10-CM   1. Generalized anxiety disorder  F41.1       Plan:  Patient is to use CBT and coping skills to decrease anxiety symptoms.  Patient is to work on plans from session to communicate with her daughter and be able to set up time for herself.  Patient is also to remind herself of the facts/truth.  Patient is to practice handouts in session and work on her cognitive distortions.  Patient is to start taking 1 night a week where she does something for herself.  Patient is to work on reminding herself of the facts/truths. Patient is to continue releasing negative emotions through exercise and yoga.  Patient is to continue working on limit setting with others.  Patient is to affirm herself regularly. Long-term goal: Resolve the core conflict that is a source of anxiety.  Decrease triggered responses that occur when having to deal with her ex-husband by 50% Short-term goal: Identify the major life complex in the past and present the form the basis for present anxiety    Stevphen Meuse, [facility]

## 2023-02-05 ENCOUNTER — Ambulatory Visit (INDEPENDENT_AMBULATORY_CARE_PROVIDER_SITE_OTHER): Payer: 59 | Admitting: Psychiatry

## 2023-02-05 DIAGNOSIS — F411 Generalized anxiety disorder: Secondary | ICD-10-CM

## 2023-02-05 NOTE — Progress Notes (Unsigned)
[facility] Counselor/Therapist Progress Note  Patient ID: Sabrina Nichols, MRN: [id number],    Date: 02/05/2023  Time Spent: 58 minutes start time 2:00 PM end time 2:58 PM Virtual Visit via Video Note Connected with patient by a telemedicine/telehealth application, with their informed consent, and verified patient privacy and that I am speaking with the correct person using two identifiers. I discussed the limitations, risks, security and privacy concerns of performing psychotherapy and the availability of in person appointments. I also discussed with the patient that there may be a patient responsible charge related to this service. The patient expressed understanding and agreed to proceed. I discussed the treatment planning with the patient. The patient was provided an opportunity to ask questions and all were answered. The patient agreed with the plan and demonstrated an understanding of the instructions. The patient was advised to call  our office if  symptoms worsen or feel they are in a crisis state and need immediate contact.   Therapist Location: home Patient Location: home    Treatment Type: Individual Therapy  Reported Symptoms: anxiety, triggered responses,  Mental Status Exam:  Appearance:   Well Groomed     Behavior:  Appropriate  Motor:  Normal  Speech/Language:   Normal Rate  Affect:  Appropriate  Mood:  anxious  Thought process:  normal  Thought content:    WNL  Sensory/Perceptual disturbances:    WNL  Orientation:  oriented to person, place, time/date, and situation  Attention:  Good  Concentration:  Good  Memory:  WNL  Fund of knowledge:   Good  Insight:    Good  Judgment:   Good  Impulse Control:  Good   Risk Assessment: Danger to Self:  No Self-injurious Behavior: No Danger to Others: No Duty to Warn:no Physical Aggression / Violence:No  Access to Firearms a concern: No  Gang Involvement:No   Subjective: Met with patient was present for session.  She  did go on her date and it went well. She explained after that she has felt confused and she is not sure what that is about for her.  She shared she is feeling upset due to him not pursuing her. She shared she is making it that she is not enough or she did something wrong. SUDS level 8, [place] "I'm not important" felt anxiety sadness in chest and throat  Interventions: {PSY:[id number]}  Diagnosis:   ICD-10-CM   1. Generalized anxiety disorder  F41.1       Plan:  Patient is to use CBT and coping skills to decrease anxiety symptoms.  Patient is to work on plans from session to communicate with her daughter and be able to set up time for herself.  Patient is also to remind herself of the facts/truth.  Patient is to practice handouts in session and work on her cognitive distortions.  Patient is to start taking 1 night a week where she does something for herself.  Patient is to work on reminding herself of the facts/truths. Patient is to continue releasing negative emotions through exercise and yoga.  Patient is to continue working on limit setting with others.  Patient is to affirm herself regularly. Long-term goal: Resolve the core conflict that is a source of anxiety.  Decrease triggered responses that occur when having to deal with her ex-husband by 50% Short-term goal: Identify the major life complex in the past and present the form the basis for present anxiety    Stevphen Meuse, [facility]

## 2023-02-07 ENCOUNTER — Ambulatory Visit: Payer: 59 | Admitting: Sports Medicine

## 2023-02-11 ENCOUNTER — Ambulatory Visit: Payer: 59 | Admitting: Psychiatry

## 2023-02-11 DIAGNOSIS — F4322 Adjustment disorder with anxiety: Secondary | ICD-10-CM | POA: Diagnosis not present

## 2023-02-12 ENCOUNTER — Ambulatory Visit: Payer: 59 | Admitting: Sports Medicine

## 2023-02-20 ENCOUNTER — Ambulatory Visit: Payer: 59 | Admitting: Psychiatry

## 2023-02-20 DIAGNOSIS — F411 Generalized anxiety disorder: Secondary | ICD-10-CM

## 2023-02-20 NOTE — Progress Notes (Signed)
      [facility] Counselor/Therapist Progress Note  Patient ID: Sabrina Nichols, MRN: [id number],    Date: 02/20/2023  Time Spent: 14 minutes start time time 12:11 PM end time 12:25 PM Virtual Visit via Video Note Connected with patient by a telemedicine/telehealth application, with their informed consent, and verified patient privacy and that I am speaking with the correct person using two identifiers. I discussed the limitations, risks, security and privacy concerns of performing psychotherapy and the availability of in person appointments. I also discussed with the patient that there may be a patient responsible charge related to this service. The patient expressed understanding and agreed to proceed. I discussed the treatment planning with the patient. The patient was provided an opportunity to ask questions and all were answered. The patient agreed with the plan and demonstrated an understanding of the instructions. The patient was advised to call  our office if  symptoms worsen or feel they are in a crisis state and need immediate contact.   Therapist Location: home Patient Location: home    Treatment Type: Individual Therapy   Subjective: Met with patient via virtual session. She shared it has been crazy due to her sister having to go into the hospital due to a blood clot on her brain.  She explained that that has been stressful for the whole family.  She is hopeful things will get better with her sister saying.  At this point she has been in the hospital about 2 weeks and feels therapy another week.  Encouraged patient to work on taking care of herself and making sure she is doing the right things even though it is very difficult.  Session was ended quickly due to clinician being sick   Diagnosis:   ICD-10-CM   1. Generalized anxiety disorder  F41.1        Silvano Pacini, LCMHC

## 2023-02-22 DIAGNOSIS — N951 Menopausal and female climacteric states: Secondary | ICD-10-CM | POA: Diagnosis not present

## 2023-02-22 DIAGNOSIS — E039 Hypothyroidism, unspecified: Secondary | ICD-10-CM | POA: Diagnosis not present

## 2023-02-22 DIAGNOSIS — R790 Abnormal level of blood mineral: Secondary | ICD-10-CM | POA: Diagnosis not present

## 2023-02-22 DIAGNOSIS — E538 Deficiency of other specified B group vitamins: Secondary | ICD-10-CM | POA: Diagnosis not present

## 2023-02-22 DIAGNOSIS — E612 Magnesium deficiency: Secondary | ICD-10-CM | POA: Diagnosis not present

## 2023-02-22 DIAGNOSIS — E559 Vitamin D deficiency, unspecified: Secondary | ICD-10-CM | POA: Diagnosis not present

## 2023-03-06 DIAGNOSIS — Z6824 Body mass index (BMI) 24.0-24.9, adult: Secondary | ICD-10-CM | POA: Diagnosis not present

## 2023-03-06 DIAGNOSIS — Z1231 Encounter for screening mammogram for malignant neoplasm of breast: Secondary | ICD-10-CM | POA: Diagnosis not present

## 2023-03-06 DIAGNOSIS — Z01419 Encounter for gynecological examination (general) (routine) without abnormal findings: Secondary | ICD-10-CM | POA: Diagnosis not present

## 2023-03-06 DIAGNOSIS — Z124 Encounter for screening for malignant neoplasm of cervix: Secondary | ICD-10-CM | POA: Diagnosis not present

## 2023-03-13 ENCOUNTER — Ambulatory Visit (INDEPENDENT_AMBULATORY_CARE_PROVIDER_SITE_OTHER): Payer: 59 | Admitting: Psychiatry

## 2023-03-13 DIAGNOSIS — F411 Generalized anxiety disorder: Secondary | ICD-10-CM | POA: Diagnosis not present

## 2023-03-13 NOTE — Progress Notes (Unsigned)
      [facility] Counselor/Therapist Progress Note  Patient ID: Sabrina Nichols, MRN: [id number],    Date: 03/13/2023  Time Spent: 52 minutes start time 8:12 AM end time 8:04 AM  Treatment Type: Individual Therapy  Reported Symptoms: anxiety, depression, irritability, low motivation, triggered responses  Mental Status Exam:  Appearance:   Well Groomed     Behavior:  Appropriate  Motor:  Normal  Speech/Language:   Normal Rate  Affect:  Appropriate  Mood:  anxious  Thought process:  normal  Thought content:    WNL  Sensory/Perceptual disturbances:    WNL  Orientation:  oriented to person, place, time/date, and situation  Attention:  Good  Concentration:  Good  Memory:  WNL  Fund of knowledge:   Good  Insight:    Good  Judgment:   Good  Impulse Control:  Good   Risk Assessment: Danger to Self:  No Self-injurious Behavior: No Danger to Others: No Duty to Warn:no Physical Aggression / Violence:No  Access to Firearms a concern: No  Gang Involvement:No   Subjective: Patient was present for session. Sabrina Nichols sister is out of the hospital which is a good thing. It is still a lot. She went on to share that she went through a depressive period and went through lots of spirals.   Interventions: {PSY:[id number]}  Diagnosis:   ICD-10-CM   1. Generalized anxiety disorder  F41.1       Plan:  Patient is to use CBT and coping skills to decrease anxiety symptoms.  Patient is to work on plans from session to look at the behavior of the guys she has been talking to as information about him rather than information about Sabrina Nichols.  Patient is also to remind herself of the facts/truth.  Patient is to practice handouts in session and work on Sabrina Nichols cognitive distortions.  Patient is to start taking 1 night a week where she does something for herself.  Patient is to work on reminding herself of the facts/truths. Patient is to continue releasing negative emotions through exercise and yoga.  Patient is to  continue working on limit setting with others.  Patient is to affirm herself regularly. Long-term goal: Resolve the core conflict that is a source of anxiety.  Decrease triggered responses that occur when having to deal with Sabrina Nichols ex-husband by 50% Short-term goal: Identify the major life complex in the past and present the form the basis for present anxiety  Stevphen Meuse, [facility]

## 2023-03-21 ENCOUNTER — Ambulatory Visit: Payer: 59 | Admitting: Psychiatry

## 2023-03-28 ENCOUNTER — Ambulatory Visit: Payer: 59 | Admitting: Dermatology

## 2023-04-03 ENCOUNTER — Ambulatory Visit: Payer: 59 | Admitting: Psychiatry

## 2023-05-06 DIAGNOSIS — N182 Chronic kidney disease, stage 2 (mild): Secondary | ICD-10-CM | POA: Diagnosis not present

## 2023-05-06 DIAGNOSIS — F325 Major depressive disorder, single episode, in full remission: Secondary | ICD-10-CM | POA: Diagnosis not present

## 2023-05-06 DIAGNOSIS — Z833 Family history of diabetes mellitus: Secondary | ICD-10-CM | POA: Diagnosis not present

## 2023-05-06 DIAGNOSIS — Z8249 Family history of ischemic heart disease and other diseases of the circulatory system: Secondary | ICD-10-CM | POA: Diagnosis not present

## 2023-05-06 DIAGNOSIS — F411 Generalized anxiety disorder: Secondary | ICD-10-CM | POA: Diagnosis not present

## 2023-05-06 DIAGNOSIS — E039 Hypothyroidism, unspecified: Secondary | ICD-10-CM | POA: Diagnosis not present

## 2023-05-15 ENCOUNTER — Ambulatory Visit (INDEPENDENT_AMBULATORY_CARE_PROVIDER_SITE_OTHER): Admitting: Psychiatry

## 2023-05-15 DIAGNOSIS — F411 Generalized anxiety disorder: Secondary | ICD-10-CM

## 2023-05-15 NOTE — Progress Notes (Signed)
 [facility] Counselor/Therapist Progress Note  Patient ID: Sabrina Nichols, MRN: [id number],    Date: 05/15/2023  Time Spent: 58 minutes start time 10:01 AM end time 10:59 AM  Treatment Type: Individual Therapy  Reported Symptoms: anxiety, sadness, isolation, rumination, triggered responses, obsessive thinking, fatigue  Mental Status Exam:  Appearance:   Casual     Behavior:  Appropriate  Motor:  Normal  Speech/Language:   Normal Rate  Affect:  Appropriate  Mood:  anxious  Thought process:  normal  Thought content:    WNL  Sensory/Perceptual disturbances:    WNL  Orientation:  oriented to person, place, time/date, and situation  Attention:  Good  Concentration:  Good  Memory:  WNL  Fund of knowledge:   Good  Insight:    Good  Judgment:   Good  Impulse Control:  Good   Risk Assessment: Danger to Self:  No Self-injurious Behavior: No Danger to Others: No Duty to Warn:no Physical Aggression / Violence:No  Access to Firearms a concern: No  Gang Involvement:No   Subjective: Patient was present for session. She shared her sister went to the hospital again and was septic and since she is her care giver it took lots out of her. She shared she finally went on zoloft and that has helped. She has found herself isolating more but as she does that she starts having social anxiety. Discussed how it seems that has happened more with taking on her care taker role. She has a friend who is wanting her to spend more time with her but she doesn't know if she wants to spend time with her and it is creating anxiety for her. Processed the issue of Guilt trips and expectations from the friend, SUDS level 9, negative cognition "I'm a bad friend and selfish" felt sadness, anxiety, resentment in stomach. She was able to reduce SUDS level to 6.  Patient was able to realize that she does have friends that she enjoys and has spent time with recently.  She went on to the share that at this time of her  life there is just so much going on that she does not have the energy to engage in as many free time activities.  Patient also shared she has not felt the support from this friend that she needs.  She was able to recognize that this friend is more like her mother who ruminates on what she needs and what she wants rather than being able to hear what patient wants.  Patient was encouraged to figure out how she would like to set limits with this friend and to recognize that so long as she is interacting with others and doing what she needs to do if there are times when she needs to isolate to reenergize than that is okay.  Interventions: Solution-Oriented/Positive Psychology, Eye Movement Desensitization and Reprocessing (EMDR), and Insight-Oriented  Diagnosis:   ICD-10-CM   1. Generalized anxiety disorder  F41.1       Plan:  Patient is to use CBT and coping skills to decrease anxiety symptoms.  Patient is to work on plans from session to continue working on her own self-care and feeling comfortable setting limits with others as she needs to.  Patient is also to remind herself of the facts/truth.  Patient is to practice handouts on her cognitive distortions.  Patient is to start taking 1 night a week where she does something for herself.  Patient is to work on reminding herself of  the facts/truths. Patient is to continue releasing negative emotions through exercise and yoga.  Patient is to continue working on limit setting with others.  Patient is to affirm herself regularly. Long-term goal: Resolve the core conflict that is a source of anxiety.  Decrease triggered responses that occur when having to deal with her ex-husband by 50% Short-term goal: Identify the major life complex in the past and present the form the basis for present anxiety  Marlise Simpers, LCMHC

## 2023-05-22 ENCOUNTER — Ambulatory Visit: Admitting: Psychiatry

## 2023-05-22 ENCOUNTER — Encounter: Payer: Self-pay | Admitting: Dermatology

## 2023-05-22 ENCOUNTER — Ambulatory Visit: Admitting: Dermatology

## 2023-05-22 VITALS — BP 96/64 | HR 76

## 2023-05-22 DIAGNOSIS — D1801 Hemangioma of skin and subcutaneous tissue: Secondary | ICD-10-CM

## 2023-05-22 DIAGNOSIS — L814 Other melanin hyperpigmentation: Secondary | ICD-10-CM

## 2023-05-22 DIAGNOSIS — R112 Nausea with vomiting, unspecified: Secondary | ICD-10-CM | POA: Insufficient documentation

## 2023-05-22 DIAGNOSIS — Z1283 Encounter for screening for malignant neoplasm of skin: Secondary | ICD-10-CM

## 2023-05-22 DIAGNOSIS — K219 Gastro-esophageal reflux disease without esophagitis: Secondary | ICD-10-CM | POA: Insufficient documentation

## 2023-05-22 DIAGNOSIS — L719 Rosacea, unspecified: Secondary | ICD-10-CM

## 2023-05-22 DIAGNOSIS — R1111 Vomiting without nausea: Secondary | ICD-10-CM | POA: Insufficient documentation

## 2023-05-22 DIAGNOSIS — D229 Melanocytic nevi, unspecified: Secondary | ICD-10-CM

## 2023-05-22 DIAGNOSIS — R238 Other skin changes: Secondary | ICD-10-CM

## 2023-05-22 DIAGNOSIS — R1033 Periumbilical pain: Secondary | ICD-10-CM | POA: Insufficient documentation

## 2023-05-22 DIAGNOSIS — L720 Epidermal cyst: Secondary | ICD-10-CM

## 2023-05-22 DIAGNOSIS — Z1211 Encounter for screening for malignant neoplasm of colon: Secondary | ICD-10-CM | POA: Insufficient documentation

## 2023-05-22 DIAGNOSIS — L709 Acne, unspecified: Secondary | ICD-10-CM | POA: Diagnosis not present

## 2023-05-22 DIAGNOSIS — W908XXA Exposure to other nonionizing radiation, initial encounter: Secondary | ICD-10-CM

## 2023-05-22 DIAGNOSIS — R1013 Epigastric pain: Secondary | ICD-10-CM | POA: Insufficient documentation

## 2023-05-22 DIAGNOSIS — L299 Pruritus, unspecified: Secondary | ICD-10-CM

## 2023-05-22 DIAGNOSIS — Z7189 Other specified counseling: Secondary | ICD-10-CM

## 2023-05-22 DIAGNOSIS — L578 Other skin changes due to chronic exposure to nonionizing radiation: Secondary | ICD-10-CM

## 2023-05-22 DIAGNOSIS — L738 Other specified follicular disorders: Secondary | ICD-10-CM

## 2023-05-22 MED ORDER — TRETINOIN 0.1 % EX CREA: TOPICAL_CREAM | Freq: Every day | CUTANEOUS | 3 refills | Status: AC

## 2023-05-22 NOTE — Progress Notes (Signed)
 New Patient Visit   Subjective  Sabrina Nichols is a 53 y.o. female who presents for the following: Total Body Skin Exam (TBSE)  Patient present today for new patient visit for TBSE.The patient denies she has spots, moles and lesions to be evaluated, some may be new or changing and the patient may have concern these could be cancer. Patient has previously been treated by a dermatologist (TBSE Yearly). Patient reports she has hx of bx (Nail Matrix, benign). Patient denies family history of skin cancers. Patient reports throughout her lifetime has had moderate sun exposure. Currently, patient reports if she has excessive sun exposure, she does apply sunscreen and/or wears protective coverings.  The following portions of the chart were reviewed this encounter and updated as appropriate: medications, allergies, medical history  Review of Systems:  No other skin or systemic complaints except as noted in HPI or Assessment and Plan.  Objective  Well appearing patient in no apparent distress; mood and affect are within normal limits.  A full examination was performed including scalp, head, eyes, ears, nose, lips, neck, chest, axillae, abdomen, back, buttocks, bilateral upper extremities, bilateral lower extremities, hands, feet, fingers, toes, fingernails, and toenails. All findings within normal limits unless otherwise noted below.   Relevant exam findings are noted in the Assessment and Plan.    Assessment & Plan   LENTIGINES, HEMANGIOMAS - Benign normal skin lesions - Benign-appearing - Call for any changes  MELANOCYTIC NEVI - Tan-brown and/or pink-flesh-colored symmetric macules and papules - Benign appearing on exam today - Observation - Call clinic for new or changing moles - Recommend daily use of broad spectrum spf 30+ sunscreen to sun-exposed areas.   ACTINIC DAMAGE - Chronic condition, secondary to cumulative UV/sun exposure - diffuse scaly erythematous macules with underlying  dyspigmentation - Recommend daily broad spectrum sunscreen SPF 30+ to sun-exposed areas, reapply every 2 hours as needed.  - Staying in the shade or wearing long sleeves, sun glasses (UVA+UVB protection) and wide brim hats (4-inch brim around the entire circumference of the hat) are also recommended for sun protection.  - Call for new or changing lesions.  1. Melanonychia - Assessment: Patient had a nail biopsy in November 2023. Results showed a melanocytic macule with purple margin involved. Cells were relatively pigmented adenoids without nesting, and melanocytes were uniform without pagetoid scatter. Thought to be a type of simple lentigo, more common in people of color. Condition is benign and likely to persist. - Plan:    No further intervention required    Patient educated on benign nature of the condition    Suggested use of nail polish for cosmetic concerns if desired  2. Rosacea - Assessment: Patient presents with redness on the nose, consistent with rosacea. Longstanding history, exacerbated by allergies or emotional distress. Previous unspecified cream treatment ineffective. Well-defined telangiectasias present. - Plan:    Avoid known triggers    Apply sunscreen regularly    Discussed laser treatment options (V-beam or X-level V) with caution due to hyperpigmentation risk    Advised against IPL treatment    Informed about prescription medication Rhofade for flushing  Lentigos and Brown Spots - Assessment: Multiple lentigos and brown spots present, likely due to sun exposure and genetic factors. No concerning lesions identified during full-body skin examination. - Plan:    Recommended Eucerin Dual Serum containing thiamidol     - Apply twice daily for 12 weeks    Provided sample of Eucerin Dual Serum    Advised  use of mineral sunscreen    Suggested Neutrogena Ultra Sheer Mineral or tinted mineral sunscreen  Dilated Pore of Winer - Assessment: Small cyst with a dilated central  pore on the back. Benign and asymptomatic. - Plan:    Observation recommended    Advised against frequent manipulation    Instructed to return if lesion grows or protrudes  Milia on Eyelid - Assessment: Recurrent milia on the eyelid reported. - Plan:    Informed patient about cosmetic removal options    Discussed potential future removal procedure ($200 for up to 15 lesions)   Pruritus in Genital Area - Assessment: Biannual episodes of pruritus in the genital area. Current examination reveals mild erythema. Differential diagnoses include contact dermatitis and allergic reactions. - Plan:    Recommended fragrance-free laundry products    Advised daily antihistamine (Zyrtec or Allegra)    Prescribed hydrocortisone 0.1% cream     - Apply daily for 2 weeks to affected area    Follow up if symptoms persist or worsen  7. Skin Care Regimen - Assessment: Patient currently uses various skincare products, including tretinoin  and BHA toner. - Plan:    Continue tretinoin  3 nights per week (Monday, Wednesday, Friday)    Use BHA toner on nights when not using tretinoin     Apply vitamin C serum every morning    Use mineral sunscreen daily    Educated on proper use and timing of products   SKIN CANCER SCREENING PERFORMED TODAY ACNE, UNSPECIFIED ACNE TYPE   Related Medications tretinoin  (RETIN-A ) 0.1 % cream Apply topically at bedtime.  Return in about 1 year (around 05/21/2024) for TBSE.  I, Jetta Ager, am acting as Neurosurgeon for [employer], DO.  Documentation: I have reviewed the above documentation for accuracy and completeness, and I agree with the above.  Louana Roup, DO

## 2023-05-22 NOTE — Patient Instructions (Addendum)
 Hello Sabrina Nichols,  Thank you for visiting today. Here is a summary of the key instructions:  - Skin Care:   - Use sunscreen daily, especially on chest and arms   - Apply Eucerin Dual Serum twice a day for brown spots   - Use mineral sunscreen with zinc  oxide or titanium dioxide   - Try Neutrogena Ultra Sheer Mineral or the peachy bottle sunscreen  - Medications:   - Use hydrocortisone 0.1% cream daily for 2 weeks on itchy areas   - Take Zyrtec or Allegra daily for allergies  - Skincare Routine:   - Use tretinoin 3 nights a week: Monday, Wednesday, Friday   - Use BHA toner on nights when not using tretinoin   - Apply Vitamin C serum every morning  - Lifestyle Changes:   - Use fragrance-free laundry detergent and fabric softener  - Follow-up:   - Return for annual skin check in one year   - Option to return for cosmetic milia removal (starts at $200 for up to 15 spots)  Please reach out if you have any questions or concerns.  Warm regards,  Dr. Louana Roup [facility]at[facility]      Important Information   Due to recent changes in healthcare laws, you may see results of your pathology and/or laboratory studies on MyChart before the doctors have had a chance to review them. We understand that in some cases there may be results that are confusing or concerning to you. Please understand that not all results are received at the same time and often the doctors may need to interpret multiple results in order to provide you with the best plan of care or course of treatment. Therefore, we ask that you please give us  2 business days to thoroughly review all your results before contacting the office for clarification. Should we see a critical lab result, you will be contacted sooner.     If You Need Anything After Your Visit   If you have any questions or concerns for your doctor, please call our main line at [phone]. If no one answers, please leave a voicemail as directed and we will return  your call as soon as possible. Messages left after 4 pm will be answered the following business day.    You may also send us  a message via MyChart. We typically respond to MyChart messages within 1-2 business days.  For prescription refills, please ask your pharmacy to contact our office. Our fax number is [phone].  If you have an urgent issue when the clinic is closed that cannot wait until the next business day, you can page your doctor at the number below.     Please note that while we do our best to be available for urgent issues outside of office hours, we are not available 24/7.    If you have an urgent issue and are unable to reach us , you may choose to seek medical care at your doctor's office, retail clinic, urgent care center, or emergency room.   If you have a medical emergency, please immediately call 911 or go to the emergency department. In the event of inclement weather, please call our main line at [phone] for an update on the status of any delays or closures.  Dermatology Medication Tips: Please keep the boxes that topical medications come in in order to help keep track of the instructions about where and how to use these. Pharmacies typically print the medication instructions only on the boxes and not  directly on the medication tubes.   If your medication is too expensive, please contact our office at [phone] or send us  a message through MyChart.    We are unable to tell what your co-pay for medications will be in advance as this is different depending on your insurance coverage. However, we may be able to find a substitute medication at lower cost or fill out paperwork to get insurance to cover a needed medication.    If a prior authorization is required to get your medication covered by your insurance company, please allow us  1-2 business days to complete this process.   Drug prices often vary depending on where the prescription is filled and some pharmacies  may offer cheaper prices.   The website www.goodrx.com contains coupons for medications through different pharmacies. The prices here do not account for what the cost may be with help from insurance (it may be cheaper with your insurance), but the website can give you the price if you did not use any insurance.  - You can print the associated coupon and take it with your prescription to the pharmacy.  - You may also stop by our office during regular business hours and pick up a GoodRx coupon card.  - If you need your prescription sent electronically to a different pharmacy, notify our office through [facility] or by phone at [phone]

## 2023-06-12 DIAGNOSIS — Z13820 Encounter for screening for osteoporosis: Secondary | ICD-10-CM | POA: Diagnosis not present

## 2023-06-12 DIAGNOSIS — Z8742 Personal history of other diseases of the female genital tract: Secondary | ICD-10-CM | POA: Diagnosis not present

## 2023-06-13 ENCOUNTER — Ambulatory Visit: Admitting: Psychiatry

## 2023-06-20 ENCOUNTER — Ambulatory Visit: Admitting: Sports Medicine

## 2023-06-25 ENCOUNTER — Encounter: Payer: Self-pay | Admitting: Sports Medicine

## 2023-06-25 ENCOUNTER — Ambulatory Visit (INDEPENDENT_AMBULATORY_CARE_PROVIDER_SITE_OTHER): Admitting: Sports Medicine

## 2023-06-25 ENCOUNTER — Ambulatory Visit: Admitting: Sports Medicine

## 2023-06-25 VITALS — BP 110/78 | Ht 65.0 in | Wt 140.0 lb

## 2023-06-25 DIAGNOSIS — G8929 Other chronic pain: Secondary | ICD-10-CM | POA: Diagnosis not present

## 2023-06-25 DIAGNOSIS — M25572 Pain in left ankle and joints of left foot: Secondary | ICD-10-CM

## 2023-06-25 NOTE — Assessment & Plan Note (Signed)
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 I am suspicious that she had and injury in the past that caused a small OCD, rupture of the ATFL, or even a syndesmotic injury.  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  - Given her ankle instability on exam she was fitted with a body helix ankle compression sleeve and she will restart her ankle stabilizing exercises.  - At this point I don't think she would benefit from arthroscopic surgery - We will follow up in 6 weeks.

## 2023-06-25 NOTE — Progress Notes (Unsigned)
 Sabrina Nichols - 53 y.o. female MRN [id number]  Date of birth: 06/27/1970  PCP: Luevenia Saha, MD  Subjective:  No chief complaint on file.  Chronic left ankle pain  HPI: Past Medical, Surgical, Social, and Family History Reviewed & Updated per EMR.   Patient is a 53 y.o. female here for follow up on chronic anterior lateral left ankle pain. She doesn't have pain at rest but has popping and pain when active. There has been some chronic swelling and no improvement since her last visit. Compression sleeve previously helped some as well as strengthening exercises but has not done these for the last year. She noticed pain initially years ago after a [employer] game but no known injury.   She has seen ortho and orthopedic foot specialists.  She has had MRI.  She has tried PT exercises.  None of these have led to definitive diagnosis or resolution.  On visit to [facility], my concern was for a loose body near insertion of peroneus tertius. (KBF)  Past Medical History:  Diagnosis Date   Adjustment disorder with anxiety 05/09/2021   Started lexapro  2022; after divorce   Anxiety    Thyroid  disease    Varicose veins    Vertigo     Current Outpatient Medications on File Prior to Visit  Medication Sig Dispense Refill   Cholecalciferol (VITAMIN D3) 250 MCG (10000 UT) capsule Take 10,000 Units by mouth daily.      DOTTI  0.075 MG/24HR Place 1 patch onto the skin 2 (two) times a week.     nitroGLYCERIN  (NITRODUR - DOSED IN MG/24 HR) 0.2 mg/hr patch Use 1/4 patch daily to the affected area 30 patch 1   progesterone  (PROMETRIUM ) 200 MG capsule      Testosterone  Propionate (FIRST-TESTOSTERONE  MC) 2 % CREA Place 5 mg onto the skin daily. 30 g 0   thyroid  (NP THYROID ) 90 MG tablet NP Thyroid      tretinoin  (RETIN-A ) 0.1 % cream Apply topically at bedtime. 45 g 3   triamcinolone cream (KENALOG) 0.1 %      ZOLOFT 50 MG tablet      No current facility-administered medications on file prior to visit.     Past Surgical History:  Procedure Laterality Date   CESAREAN SECTION  11/16/2007   laproscopic surgery to remove IUD  03/15/2008   LEEP      Allergies  Allergen Reactions   Percocet [Oxycodone-Acetaminophen] Nausea And Vomiting        Objective:  Physical Exam: VS: BP:110/78  HR: bpm  TEMP: ( )  RESP:   HT:5' 5 (165.1 cm)   WT:140 lb (63.5 kg)  BMI:23.3  Gen: NAD, speaks clearly, comfortable in exam room Respiratory: Normal respiratory effort on room air. No signs of distress Skin: No rashes, abrasions, or ecchymosis MSK: Left Ankle: No visible erythema or ecchymosis. There is some mild soft tissue edema over the anteriolateral joint distal to the fibula. Range of motion is full. Strength is 5/5 in all directions Heal lift w/ mild peroneal weakness Anterior drawer positive, Talar tilt positive for laxity but no pain  Squeeze test negative, Kleiger test negative No sign of peroneal tendon subluxations or tenderness to palpation No sensory deficits Dorsalis pedis pulse equal bilat    Assessment & Plan:   Chronic pain of left ankle - The patient's pain seems to be largely from instability in the ankle. I am suspicious that she had and injury in the past that caused a small OCD,  rupture of the ATFL, or even a syndesmotic injury.  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  - Given her ankle instability on exam she was fitted with a body helix ankle compression sleeve and she will restart her ankle stabilizing exercises.  - At this point I don't think she would benefit from arthroscopic surgery - We will follow up in 6 weeks.     Berneda Bridges MD [facility] Fellow

## 2023-06-27 ENCOUNTER — Ambulatory Visit (INDEPENDENT_AMBULATORY_CARE_PROVIDER_SITE_OTHER): Admitting: Psychiatry

## 2023-06-27 DIAGNOSIS — F411 Generalized anxiety disorder: Secondary | ICD-10-CM

## 2023-06-27 NOTE — Progress Notes (Signed)
 [facility] Counselor/Therapist Progress Note  Patient ID: Modelle Vollmer, MRN: [id number],    Date: 06/27/2023  Time Spent: 55 minutes start time 1:03 PM end time 1:58 PM  Treatment Type: Individual Therapy  Reported Symptoms: anxiety, sadness, rumination triggered responses  Mental Status Exam:  Appearance:   Casual     Behavior:  Appropriate  Motor:  Normal  Speech/Language:   Normal Rate  Affect:  Appropriate  Mood:  anxious  Thought process:  normal  Thought content:    WNL  Sensory/Perceptual disturbances:    WNL  Orientation:  oriented to person, place, time/date, and situation  Attention:  Good  Concentration:  Good  Memory:  WNL  Fund of knowledge:   Good  Insight:    Good  Judgment:   Good  Impulse Control:  Good   Risk Assessment: Danger to Self:  No Self-injurious Behavior: No Danger to Others: No Duty to Warn:no Physical Aggression / Violence:No  Access to Firearms a concern: No  Gang Involvement:No   Subjective: Patient was present for session.  Patient has been busy with her kids and doing a lot with her care giving role with her sister. It has been a year since her ex has seen the girls and 6 months since he has even communicated with them.  Patient explained it has been very triggering for her.  She shared that he is not paying his child support regularly like he typically does and she knows they can go back to court in October to make changes some things.  Patient stated she is having lots of anxiety because she feels something is going on but she is not sure what to anticipate.  Patient also shared that she is having anxiety with her daughter driving because again she is not sure what will happen and how to anticipate things and knows that when she lets her start driving on her own she will be able to monitor her choices and behaviors.  Patient was encouraged to think through times when she has had thoughts of anxiety surrounding uncertainty.  She was  able to realize back to growing up and and thought through what she needed to let that part of her note.  The patient was then able to think through the situation with her ex-husband and go down the what if trying.  When she got to the end of that training she was able to realize that she and the girls will be okay and that gave her a sense of peace.  Had  patient do a mindfulness exercise in session.  The exercise helped her to recognize she could have her feelings but she does not need to become her feelings discussed tools to keep her at a good place.  Interventions: Dialectical Behavioral Therapy, Mindfulness Meditation, and Insight-Oriented  Diagnosis:   ICD-10-CM   1. Generalized anxiety disorder  F41.1       Plan:  Patient is to use CBT,DBT, mindfulness and coping skills to decrease anxiety symptoms.  Patient is to work on work on her own self-care and feeling comfortable setting limits with others as she needs to.  Patient is also to remind herself of the facts/truth.  Patient is to practice handouts on her cognitive distortions.  Patient is to start taking 1 night a week where she does something for herself.  Patient is to work on reminding herself of the facts/truths. Patient is to continue releasing negative emotions through exercise and yoga.  Patient is to continue working on limit setting with others.  Patient is to affirm herself regularly.   Marlise Simpers, [facility]

## 2023-07-15 ENCOUNTER — Ambulatory Visit: Admitting: Family Medicine

## 2023-07-16 ENCOUNTER — Encounter: Payer: Self-pay | Admitting: Sports Medicine

## 2023-07-23 ENCOUNTER — Ambulatory Visit: Admitting: Family Medicine

## 2023-08-15 ENCOUNTER — Ambulatory Visit (INDEPENDENT_AMBULATORY_CARE_PROVIDER_SITE_OTHER): Admitting: Psychiatry

## 2023-08-15 DIAGNOSIS — F411 Generalized anxiety disorder: Secondary | ICD-10-CM

## 2023-08-15 NOTE — Progress Notes (Signed)
 [facility] Counselor/Therapist Progress Note  Patient ID: Sabrina Nichols, MRN: [id number],    Date: 08/15/2023  Time Spent: 60 minutes start time 12:00 PM end time 1:00 PM  Treatment Type: Individual Therapy  Reported Symptoms: anxiety, sadness, triggered responses, rumination  Mental Status Exam:  Appearance:   Casual     Behavior:  Appropriate  Motor:  Normal  Speech/Language:   Normal Rate  Affect:  Appropriate tearful  Mood:  anxious  Thought process:  normal  Thought content:    WNL  Sensory/Perceptual disturbances:    WNL  Orientation:  oriented to person, place, time/date, and situation  Attention:  Good  Concentration:  Good  Memory:  WNL  Fund of knowledge:   Good  Insight:    Good  Judgment:   Good  Impulse Control:  Good   Risk Assessment: Danger to Self:  No Self-injurious Behavior: No Danger to Others: No Duty to Warn:no Physical Aggression / Violence:No  Access to Firearms a concern: No  Gang Involvement:No   Subjective: Patient was present for session. She shared that her sister ended up back in the hospital and it was really bad. She went on to share that it is hard because when she comes back home she ends up looking worse. She shared that she is still not doing well and it is heart breaking for her.  Patient stated she has been in freeze mode since January because of the multiple episodes in the hospital when she may or may not survive. She went on to share it is hard on the whole family and she doesn't feel she can talk about it.  Patient was given the opportunity just to discuss her feelings and the different situation and dynamics.  Through that processing she was able to recognize that one of the biggest issue is her mother will want her to do something but when she starts to do it she does not like the way she handles that are wants it done differently.  Patient stated she constantly feels that her mom calls her in crisis but then will let her fix  the problem which is very frustrating for her and she can feel herself getting more exhausted.  She went on to share that even going to doctor's appointments is difficult with her mom because she argues with the doctor about her sister's care and she has to intervene in the situation.  Patient shared she cannot even talk to her sister one-on-one because it takes her a long time to communicate and her mother intervenes and does not give them time so she can even really know what her sister is needing.  Patient did share that the thing that her mother will not give up is her sessions with her guru that happened twice a week virtually.  Encouraged patient to try to make contact with her sister during those appointments so that she can at least get a feeling for what is going on with her sister and how to help her the best and does not feel so powerless and stuck in the situation.  Agreed that if things do not change with the dynamics with mom more processing will be set up for future sessions.  Interventions: Solution-Oriented/Positive Psychology and Humanistic/Existential  Diagnosis:   ICD-10-CM   1. Generalized anxiety disorder  F41.1       Plan:  Patient is to use CBT,DBT, mindfulness and coping skills to decrease anxiety symptoms.  Patient is to  try to have time alone with her sister so she can figure out what she needs to do so that she can help her the best.  Patient is also to remind herself of the facts/truth.  Patient is to practice handouts on her cognitive distortions.  Patient is to start taking 1 night a week where she does something for herself.  Patient is to work on reminding herself of the facts/truths. Patient is to continue releasing negative emotions through exercise and yoga.  Patient is to continue working on limit setting with others.  Patient is to affirm herself regularly.   Silvano Pacini, [facility]

## 2023-08-27 DIAGNOSIS — N951 Menopausal and female climacteric states: Secondary | ICD-10-CM | POA: Diagnosis not present

## 2023-08-27 DIAGNOSIS — Z13228 Encounter for screening for other metabolic disorders: Secondary | ICD-10-CM | POA: Diagnosis not present

## 2023-08-27 DIAGNOSIS — E039 Hypothyroidism, unspecified: Secondary | ICD-10-CM | POA: Diagnosis not present

## 2023-08-27 DIAGNOSIS — Z131 Encounter for screening for diabetes mellitus: Secondary | ICD-10-CM | POA: Diagnosis not present

## 2023-08-27 DIAGNOSIS — Z130 Encounter for screening for diseases of the blood and blood-forming organs and certain disorders involving the immune mechanism: Secondary | ICD-10-CM | POA: Diagnosis not present

## 2023-09-04 DIAGNOSIS — R87613 High grade squamous intraepithelial lesion on cytologic smear of cervix (HGSIL): Secondary | ICD-10-CM | POA: Diagnosis not present

## 2023-09-11 ENCOUNTER — Ambulatory Visit: Admitting: Psychiatry

## 2023-09-19 ENCOUNTER — Ambulatory Visit (INDEPENDENT_AMBULATORY_CARE_PROVIDER_SITE_OTHER): Admitting: Psychiatry

## 2023-09-19 DIAGNOSIS — F411 Generalized anxiety disorder: Secondary | ICD-10-CM

## 2023-09-19 NOTE — Progress Notes (Signed)
      [facility] Counselor/Therapist Progress Note  Patient ID: Sabrina Nichols, MRN: [id number],    Date: 09/19/2023  Time Spent: 50 minutes start time 2:10 PM end time 3 PM  Treatment Type: Individual Therapy  Reported Symptoms: anxiety, sadness, triggered responses, rumination,sleep issues, anger  Mental Status Exam:  Appearance:   Well Groomed     Behavior:  Appropriate  Motor:  Normal  Speech/Language:   Normal Rate  Affect:  Appropriate  Mood:  anxious  Thought process:  normal  Thought content:    WNL  Sensory/Perceptual disturbances:    WNL  Orientation:  oriented to person, place, time/date, and situation  Attention:  Good  Concentration:  Good  Memory:  WNL  Fund of knowledge:   Good  Insight:    Good  Judgment:   Good  Impulse Control:  Good   Risk Assessment: Danger to Self:  No Self-injurious Behavior: No Danger to Others: No Duty to Warn:no Physical Aggression / Violence:No  Access to Firearms a concern: No  Gang Involvement:No   Subjective: Patient was present for session. She shared that there have been lots of frustrations with her volleyball coach. She went on to share that she feels her daughter is managing her emotions well. Patient went on to share that the coach had asked her for lots of financial support and now she is being distant.  Did processing set on the issue with the volleyball. Did processing set on daughter being treated badly, SUDS level 10 negative cognition I'm powerless felt anger sadness anxiety in her chest.  Patient was able to reduce as level to full work.  She was able to see that she is felt powerless with the situation with her ex and her children as well as other situations in her life.  Patient was able to remind herself to focus on what she can do something about a different things were discussed in session.  Interventions: Eye Movement Desensitization and Reprocessing (EMDR) and Insight-Oriented  Diagnosis:   ICD-10-CM   1.  Generalized anxiety disorder  F41.1       Plan:  Patient is to use CBT,DBT, mindfulness and coping skills to decrease anxiety symptoms.  Patient is to try to focus on what she can do something about.  Patient is also to remind herself of the facts/truth.  Patient is to practice handouts on her cognitive distortions.  Patient is to start taking 1 night a week where she does something for herself.  Patient is to work on reminding herself of the facts/truths. Patient is to continue releasing negative emotions through exercise and yoga.  Patient is to continue working on limit setting with others.  Patient is to affirm herself regularly.   Silvano Pacini, [facility]

## 2023-10-10 ENCOUNTER — Ambulatory Visit: Admitting: Psychiatry

## 2023-10-17 ENCOUNTER — Ambulatory Visit: Admitting: Psychiatry

## 2023-10-21 DIAGNOSIS — F4322 Adjustment disorder with anxiety: Secondary | ICD-10-CM | POA: Diagnosis not present

## 2023-11-07 ENCOUNTER — Ambulatory Visit (INDEPENDENT_AMBULATORY_CARE_PROVIDER_SITE_OTHER): Admitting: Psychiatry

## 2023-11-07 DIAGNOSIS — F411 Generalized anxiety disorder: Secondary | ICD-10-CM | POA: Diagnosis not present

## 2023-11-07 NOTE — Progress Notes (Signed)
 [facility] Counselor/Therapist Progress Note  Patient ID: Sabrina Nichols, MRN: [id number],    Date: 11/07/2023  Time Spent: 55 minutes start time 12:04 PM end time 12:59 PM  Treatment Type: Individual Therapy  Reported Symptoms: anxiety, triggered responses, rumination, overwhelmed, sad  Mental Status Exam:  Appearance:   Well Groomed     Behavior:  Appropriate  Motor:  Normal  Speech/Language:   Normal Rate  Affect:  Appropriate  Mood:  anxious  Thought process:  normal  Thought content:    WNL  Sensory/Perceptual disturbances:    WNL  Orientation:  oriented to person, place, time/date, and situation  Attention:  Good  Concentration:  Good  Memory:  WNL  Fund of knowledge:   Good  Insight:    Good  Judgment:   Good  Impulse Control:  Good   Risk Assessment: Danger to Self:  No Self-injurious Behavior: No Danger to Others: No Duty to Warn:no Physical Aggression / Violence:No  Access to Firearms a concern: No  Gang Involvement:No   Subjective: Patient was present for session. She shared that things are still hard with her sister even though she hasn't been in the hospital she has very good and bad days. She went on to share her mother text her all the time updates on it so that is so stressful. Discussed how stressful it is due to her mother only calling when she wants to complain or has something she wants her to do.  Patient explained trying to work take care of her children and be the coordinator/caretaker for her sister is overwhelming.  Patient stated she loves her sister but she does good and then her health gets really bad and then she does good and her health is bad then she has thoughts of not wanting to be here and that or choosing not to eat and her mother is texting her that updates constantly and having her do something to try and help or intervene in the situations.  Patient stated that on top of being a mother of 2 teenagers and her ex still being very  difficult gets exhausting for her.  Patient stated she is putting herself not wanting to go out with her friends or do anything but to stay at home.  Encouraged patient to recognize that the situation is very overwhelming and difficult and it is not that she is the problem.  Discussed how she is going to have to find some ways to work on self-care and finding some joy.  Discussed a variety of different options and finally it seemed that she may be able to take 1 weekend with just her kids and put her brother or sister in charge of taking care of her mother and other sister a month.  Discussed how that way she can feel she is able to be present for her children and as well as for herself.  It would also give her something to look forward to.  Interventions: Solution-Oriented/Positive Psychology  Diagnosis:   ICD-10-CM   1. Generalized anxiety disorder  F41.1       Plan: Patient is to use CBT,DBT, mindfulness and coping skills to decrease anxiety symptoms.  Patient is to try to and start working on taking time for she and her daughters away from the responsibility of caring for her mom and sister for a weekend a month.  Patient is also to remind herself of the facts/truth.  Patient is to practice handouts on her cognitive  distortions.  Patient is to start taking 1 night a week where she does something for herself.  Patient is to work on reminding herself of the facts/truths. Patient is to continue releasing negative emotions through exercise and yoga.  Patient is to continue working on limit setting with others.  Patient is to affirm herself regularly.     Silvano Pacini, [facility]

## 2024-05-25 ENCOUNTER — Ambulatory Visit: Admitting: Dermatology
